# Patient Record
Sex: Female | Born: 1945 | Race: White | Hispanic: No | Smoking: Never smoker
Health system: Southern US, Community
[De-identification: ages and names within clinical notes are randomized; demographics above are authoritative.]

## PROBLEM LIST (undated history)

## (undated) DIAGNOSIS — M25561 Pain in right knee: Secondary | ICD-10-CM

## (undated) DIAGNOSIS — M1990 Unspecified osteoarthritis, unspecified site: Secondary | ICD-10-CM

## (undated) DIAGNOSIS — F329 Major depressive disorder, single episode, unspecified: Secondary | ICD-10-CM

## (undated) DIAGNOSIS — T7431XA Adult psychological abuse, confirmed, initial encounter: Secondary | ICD-10-CM

## (undated) DIAGNOSIS — M5136 Other intervertebral disc degeneration, lumbar region: Secondary | ICD-10-CM

## (undated) DIAGNOSIS — L309 Dermatitis, unspecified: Secondary | ICD-10-CM

## (undated) DIAGNOSIS — M5126 Other intervertebral disc displacement, lumbar region: Secondary | ICD-10-CM

## (undated) DIAGNOSIS — K635 Polyp of colon: Secondary | ICD-10-CM

## (undated) DIAGNOSIS — K219 Gastro-esophageal reflux disease without esophagitis: Secondary | ICD-10-CM

## (undated) DIAGNOSIS — S3210XA Unspecified fracture of sacrum, initial encounter for closed fracture: Secondary | ICD-10-CM

## (undated) DIAGNOSIS — E785 Hyperlipidemia, unspecified: Secondary | ICD-10-CM

## (undated) DIAGNOSIS — F419 Anxiety disorder, unspecified: Secondary | ICD-10-CM

## (undated) DIAGNOSIS — I872 Venous insufficiency (chronic) (peripheral): Secondary | ICD-10-CM

## (undated) DIAGNOSIS — M8580 Other specified disorders of bone density and structure, unspecified site: Secondary | ICD-10-CM

## (undated) DIAGNOSIS — N6019 Diffuse cystic mastopathy of unspecified breast: Secondary | ICD-10-CM

## (undated) DIAGNOSIS — F32A Depression, unspecified: Secondary | ICD-10-CM

## (undated) DIAGNOSIS — S322XXA Fracture of coccyx, initial encounter for closed fracture: Secondary | ICD-10-CM

## (undated) DIAGNOSIS — E079 Disorder of thyroid, unspecified: Secondary | ICD-10-CM

## (undated) HISTORY — DX: Unspecified osteoarthritis, unspecified site: M19.90

## (undated) HISTORY — DX: Other intervertebral disc degeneration, lumbar region: M51.36

## (undated) HISTORY — DX: Dermatitis, unspecified: L30.9

## (undated) HISTORY — PX: DILATION AND CURETTAGE OF UTERUS: SHX78

## (undated) HISTORY — DX: Gastro-esophageal reflux disease without esophagitis: K21.9

## (undated) HISTORY — DX: Anxiety disorder, unspecified: F41.9

## (undated) HISTORY — DX: Fracture of coccyx, initial encounter for closed fracture: S32.2XXA

## (undated) HISTORY — DX: Polyp of colon: K63.5

## (undated) HISTORY — PX: CATARACT EXTRACTION: SUR2

## (undated) HISTORY — PX: BREAST LUMPECTOMY: SHX2

## (undated) HISTORY — PX: LASIK: SHX215

## (undated) HISTORY — DX: Venous insufficiency (chronic) (peripheral): I87.2

## (undated) HISTORY — DX: Disorder of thyroid, unspecified: E07.9

## (undated) HISTORY — DX: Hyperlipidemia, unspecified: E78.5

## (undated) HISTORY — DX: Other intervertebral disc displacement, lumbar region: M51.26

## (undated) HISTORY — DX: Diffuse cystic mastopathy of unspecified breast: N60.19

## (undated) HISTORY — DX: Other specified disorders of bone density and structure, unspecified site: M85.80

## (undated) HISTORY — DX: Adult psychological abuse, confirmed, initial encounter: T74.31XA

## (undated) HISTORY — DX: Pain in right knee: M25.561

## (undated) HISTORY — DX: Unspecified fracture of sacrum, initial encounter for closed fracture: S32.10XA

---

## 1998-06-05 HISTORY — PX: ROTATOR CUFF REPAIR: SHX139

## 2002-06-05 DIAGNOSIS — IMO0002 Reserved for concepts with insufficient information to code with codable children: Secondary | ICD-10-CM

## 2002-06-05 HISTORY — DX: Reserved for concepts with insufficient information to code with codable children: IMO0002

## 2006-09-20 ENCOUNTER — Ambulatory Visit: Payer: Self-pay | Admitting: Internal Medicine

## 2007-05-10 ENCOUNTER — Ambulatory Visit: Payer: Self-pay | Admitting: Internal Medicine

## 2007-05-24 ENCOUNTER — Ambulatory Visit: Payer: Self-pay | Admitting: Internal Medicine

## 2007-05-24 ENCOUNTER — Encounter: Payer: Self-pay | Admitting: Internal Medicine

## 2009-07-08 DIAGNOSIS — F419 Anxiety disorder, unspecified: Secondary | ICD-10-CM | POA: Insufficient documentation

## 2009-07-08 DIAGNOSIS — Z87898 Personal history of other specified conditions: Secondary | ICD-10-CM | POA: Insufficient documentation

## 2009-07-08 DIAGNOSIS — F329 Major depressive disorder, single episode, unspecified: Secondary | ICD-10-CM | POA: Insufficient documentation

## 2011-02-08 DIAGNOSIS — M546 Pain in thoracic spine: Secondary | ICD-10-CM | POA: Insufficient documentation

## 2011-03-28 DIAGNOSIS — Z9889 Other specified postprocedural states: Secondary | ICD-10-CM | POA: Insufficient documentation

## 2011-05-29 ENCOUNTER — Encounter: Payer: Self-pay | Admitting: *Deleted

## 2011-05-29 ENCOUNTER — Emergency Department (HOSPITAL_BASED_OUTPATIENT_CLINIC_OR_DEPARTMENT_OTHER)
Admission: EM | Admit: 2011-05-29 | Discharge: 2011-05-29 | Disposition: A | Discharge status: Home / Self Care | Payer: Medicare Other | Attending: Emergency Medicine | Admitting: Emergency Medicine

## 2011-05-29 DIAGNOSIS — H5710 Ocular pain, unspecified eye: Secondary | ICD-10-CM | POA: Insufficient documentation

## 2011-05-29 DIAGNOSIS — F329 Major depressive disorder, single episode, unspecified: Secondary | ICD-10-CM | POA: Insufficient documentation

## 2011-05-29 DIAGNOSIS — F3289 Other specified depressive episodes: Secondary | ICD-10-CM | POA: Insufficient documentation

## 2011-05-29 HISTORY — DX: Major depressive disorder, single episode, unspecified: F32.9

## 2011-05-29 HISTORY — DX: Depression, unspecified: F32.A

## 2011-05-29 MED ORDER — TETRACAINE HCL 0.5 % OP SOLN
OPHTHALMIC | Status: AC
  Administered 2011-05-29: 13:00:00
  Filled 2011-05-29: qty 2

## 2011-05-29 MED ORDER — TOBRAMYCIN 0.3 % OP SOLN: 2.0000 [drp] | Freq: Four times a day (QID) | OPHTHALMIC | Status: AC

## 2011-05-29 MED ORDER — TOBRAMYCIN-DEXAMETHASONE 0.3-0.1 % OP SUSP
OPHTHALMIC | Status: AC
  Filled 2011-05-29: qty 2.5

## 2011-05-29 MED ORDER — ACYCLOVIR 800 MG PO TABS: 800.0000 mg | ORAL_TABLET | Freq: Every day | ORAL | Status: AC

## 2011-05-29 MED ORDER — TOBRAMYCIN 0.3 % OP SOLN
2.0000 [drp] | Freq: Four times a day (QID) | OPHTHALMIC | Status: DC
  Filled 2011-05-29: qty 5

## 2011-05-29 MED ORDER — FLUORESCEIN SODIUM 1 MG OP STRP
ORAL_STRIP | OPHTHALMIC | Status: AC
  Administered 2011-05-29: 13:00:00
  Filled 2011-05-29: qty 1

## 2011-05-29 NOTE — ED Notes (Signed)
Visual acuity with glasses right eye 20/50 left eye 20/50 both eyes 20/50

## 2011-05-29 NOTE — ED Provider Notes (Signed)
History/physical exam/procedure(s) were performed by non-physician practitioner and as supervising physician I was immediately available for consultation/collaboration. I have reviewed all notes and am in agreement with care and plan.    Hilario Quarry, MD 05/29/11 [phone]

## 2011-05-29 NOTE — ED Notes (Signed)
Right eye itching, red and swollen x 5 days.
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
## 2011-06-08 DIAGNOSIS — H00029 Hordeolum internum unspecified eye, unspecified eyelid: Secondary | ICD-10-CM | POA: Diagnosis not present

## 2011-07-12 DIAGNOSIS — L570 Actinic keratosis: Secondary | ICD-10-CM | POA: Diagnosis not present

## 2011-07-12 DIAGNOSIS — L821 Other seborrheic keratosis: Secondary | ICD-10-CM | POA: Diagnosis not present

## 2011-07-18 DIAGNOSIS — F333 Major depressive disorder, recurrent, severe with psychotic symptoms: Secondary | ICD-10-CM | POA: Diagnosis not present

## 2011-07-25 DIAGNOSIS — L570 Actinic keratosis: Secondary | ICD-10-CM | POA: Diagnosis not present

## 2011-08-03 DIAGNOSIS — F331 Major depressive disorder, recurrent, moderate: Secondary | ICD-10-CM | POA: Diagnosis not present

## 2011-08-04 DIAGNOSIS — M8580 Other specified disorders of bone density and structure, unspecified site: Secondary | ICD-10-CM

## 2011-08-04 HISTORY — DX: Other specified disorders of bone density and structure, unspecified site: M85.80

## 2011-08-14 DIAGNOSIS — M899 Disorder of bone, unspecified: Secondary | ICD-10-CM | POA: Diagnosis not present

## 2011-08-14 DIAGNOSIS — E785 Hyperlipidemia, unspecified: Secondary | ICD-10-CM | POA: Diagnosis not present

## 2011-08-14 DIAGNOSIS — M949 Disorder of cartilage, unspecified: Secondary | ICD-10-CM | POA: Diagnosis not present

## 2011-08-16 DIAGNOSIS — M949 Disorder of cartilage, unspecified: Secondary | ICD-10-CM | POA: Diagnosis not present

## 2011-08-16 DIAGNOSIS — Z1231 Encounter for screening mammogram for malignant neoplasm of breast: Secondary | ICD-10-CM | POA: Diagnosis not present

## 2011-08-18 DIAGNOSIS — F331 Major depressive disorder, recurrent, moderate: Secondary | ICD-10-CM | POA: Diagnosis not present

## 2011-08-21 DIAGNOSIS — E785 Hyperlipidemia, unspecified: Secondary | ICD-10-CM | POA: Diagnosis not present

## 2011-08-21 DIAGNOSIS — K219 Gastro-esophageal reflux disease without esophagitis: Secondary | ICD-10-CM | POA: Diagnosis not present

## 2011-08-21 DIAGNOSIS — M546 Pain in thoracic spine: Secondary | ICD-10-CM | POA: Diagnosis not present

## 2011-08-21 DIAGNOSIS — Z0000 Encounter for general adult medical examination without abnormal findings: Secondary | ICD-10-CM | POA: Diagnosis not present

## 2011-08-21 DIAGNOSIS — R928 Other abnormal and inconclusive findings on diagnostic imaging of breast: Secondary | ICD-10-CM | POA: Diagnosis not present

## 2011-08-21 DIAGNOSIS — Z803 Family history of malignant neoplasm of breast: Secondary | ICD-10-CM | POA: Diagnosis not present

## 2011-08-23 DIAGNOSIS — F331 Major depressive disorder, recurrent, moderate: Secondary | ICD-10-CM | POA: Diagnosis not present

## 2011-08-29 DIAGNOSIS — F331 Major depressive disorder, recurrent, moderate: Secondary | ICD-10-CM | POA: Diagnosis not present

## 2011-09-20 DIAGNOSIS — F330 Major depressive disorder, recurrent, mild: Secondary | ICD-10-CM | POA: Diagnosis not present

## 2011-09-25 DIAGNOSIS — F330 Major depressive disorder, recurrent, mild: Secondary | ICD-10-CM | POA: Diagnosis not present

## 2011-10-12 DIAGNOSIS — F330 Major depressive disorder, recurrent, mild: Secondary | ICD-10-CM | POA: Diagnosis not present

## 2011-11-02 DIAGNOSIS — F330 Major depressive disorder, recurrent, mild: Secondary | ICD-10-CM | POA: Diagnosis not present

## 2011-11-13 ENCOUNTER — Inpatient Hospital Stay: Admission: RE | Admit: 2011-11-13 | Payer: Medicare Other | Source: Ambulatory Visit

## 2011-11-13 ENCOUNTER — Other Ambulatory Visit: Payer: Medicare Other

## 2011-11-13 ENCOUNTER — Other Ambulatory Visit: Payer: Self-pay | Admitting: Internal Medicine

## 2011-11-13 DIAGNOSIS — R198 Other specified symptoms and signs involving the digestive system and abdomen: Secondary | ICD-10-CM | POA: Diagnosis not present

## 2011-11-13 DIAGNOSIS — K30 Functional dyspepsia: Secondary | ICD-10-CM | POA: Insufficient documentation

## 2011-11-13 DIAGNOSIS — R1013 Epigastric pain: Secondary | ICD-10-CM

## 2011-11-13 DIAGNOSIS — K3189 Other diseases of stomach and duodenum: Secondary | ICD-10-CM | POA: Diagnosis not present

## 2011-11-13 DIAGNOSIS — R109 Unspecified abdominal pain: Secondary | ICD-10-CM | POA: Diagnosis not present

## 2011-11-13 DIAGNOSIS — R143 Flatulence: Secondary | ICD-10-CM | POA: Diagnosis not present

## 2011-11-13 DIAGNOSIS — R141 Gas pain: Secondary | ICD-10-CM | POA: Diagnosis not present

## 2011-11-14 ENCOUNTER — Other Ambulatory Visit: Payer: Medicare Other

## 2011-11-15 ENCOUNTER — Ambulatory Visit
Admission: RE | Admit: 2011-11-15 | Discharge: 2011-11-15 | Disposition: A | Discharge status: Home / Self Care | Payer: Medicare Other | Source: Ambulatory Visit | Attending: Internal Medicine | Admitting: Internal Medicine

## 2011-11-15 DIAGNOSIS — R1013 Epigastric pain: Secondary | ICD-10-CM

## 2011-11-15 DIAGNOSIS — R141 Gas pain: Secondary | ICD-10-CM | POA: Diagnosis not present

## 2011-11-15 DIAGNOSIS — R142 Eructation: Secondary | ICD-10-CM | POA: Diagnosis not present

## 2011-11-15 DIAGNOSIS — K5900 Constipation, unspecified: Secondary | ICD-10-CM | POA: Diagnosis not present

## 2011-11-15 DIAGNOSIS — K3189 Other diseases of stomach and duodenum: Secondary | ICD-10-CM | POA: Diagnosis not present

## 2011-11-16 DIAGNOSIS — F330 Major depressive disorder, recurrent, mild: Secondary | ICD-10-CM | POA: Diagnosis not present

## 2011-11-21 DIAGNOSIS — IMO0002 Reserved for concepts with insufficient information to code with codable children: Secondary | ICD-10-CM | POA: Diagnosis not present

## 2011-12-12 DIAGNOSIS — F330 Major depressive disorder, recurrent, mild: Secondary | ICD-10-CM | POA: Diagnosis not present

## 2012-01-08 DIAGNOSIS — F330 Major depressive disorder, recurrent, mild: Secondary | ICD-10-CM | POA: Diagnosis not present

## 2012-01-30 DIAGNOSIS — F330 Major depressive disorder, recurrent, mild: Secondary | ICD-10-CM | POA: Diagnosis not present

## 2012-02-14 DIAGNOSIS — H524 Presbyopia: Secondary | ICD-10-CM | POA: Diagnosis not present

## 2012-02-14 DIAGNOSIS — H353190 Nonexudative age-related macular degeneration, unspecified eye, stage unspecified: Secondary | ICD-10-CM | POA: Diagnosis not present

## 2012-02-14 DIAGNOSIS — H2510 Age-related nuclear cataract, unspecified eye: Secondary | ICD-10-CM | POA: Diagnosis not present

## 2012-02-22 DIAGNOSIS — F331 Major depressive disorder, recurrent, moderate: Secondary | ICD-10-CM | POA: Diagnosis not present

## 2012-03-15 DIAGNOSIS — IMO0002 Reserved for concepts with insufficient information to code with codable children: Secondary | ICD-10-CM | POA: Diagnosis not present

## 2012-03-21 DIAGNOSIS — IMO0002 Reserved for concepts with insufficient information to code with codable children: Secondary | ICD-10-CM | POA: Diagnosis not present

## 2012-04-11 DIAGNOSIS — F330 Major depressive disorder, recurrent, mild: Secondary | ICD-10-CM | POA: Diagnosis not present

## 2012-06-11 DIAGNOSIS — F330 Major depressive disorder, recurrent, mild: Secondary | ICD-10-CM | POA: Diagnosis not present

## 2012-07-10 DIAGNOSIS — H9319 Tinnitus, unspecified ear: Secondary | ICD-10-CM | POA: Diagnosis not present

## 2012-07-10 DIAGNOSIS — J387 Other diseases of larynx: Secondary | ICD-10-CM | POA: Diagnosis not present

## 2012-07-10 DIAGNOSIS — J310 Chronic rhinitis: Secondary | ICD-10-CM | POA: Diagnosis not present

## 2012-07-10 DIAGNOSIS — H905 Unspecified sensorineural hearing loss: Secondary | ICD-10-CM | POA: Diagnosis not present

## 2012-07-10 DIAGNOSIS — K219 Gastro-esophageal reflux disease without esophagitis: Secondary | ICD-10-CM | POA: Diagnosis not present

## 2012-07-29 DIAGNOSIS — F330 Major depressive disorder, recurrent, mild: Secondary | ICD-10-CM | POA: Diagnosis not present

## 2012-08-05 DIAGNOSIS — IMO0002 Reserved for concepts with insufficient information to code with codable children: Secondary | ICD-10-CM | POA: Diagnosis not present

## 2012-08-19 DIAGNOSIS — R82998 Other abnormal findings in urine: Secondary | ICD-10-CM | POA: Diagnosis not present

## 2012-08-19 DIAGNOSIS — E785 Hyperlipidemia, unspecified: Secondary | ICD-10-CM | POA: Diagnosis not present

## 2012-08-19 DIAGNOSIS — M810 Age-related osteoporosis without current pathological fracture: Secondary | ICD-10-CM | POA: Diagnosis not present

## 2012-08-26 DIAGNOSIS — K3189 Other diseases of stomach and duodenum: Secondary | ICD-10-CM | POA: Diagnosis not present

## 2012-08-26 DIAGNOSIS — M1990 Unspecified osteoarthritis, unspecified site: Secondary | ICD-10-CM | POA: Insufficient documentation

## 2012-08-26 DIAGNOSIS — N6019 Diffuse cystic mastopathy of unspecified breast: Secondary | ICD-10-CM | POA: Diagnosis not present

## 2012-08-26 DIAGNOSIS — L259 Unspecified contact dermatitis, unspecified cause: Secondary | ICD-10-CM | POA: Diagnosis not present

## 2012-08-26 DIAGNOSIS — R141 Gas pain: Secondary | ICD-10-CM | POA: Diagnosis not present

## 2012-08-26 DIAGNOSIS — I872 Venous insufficiency (chronic) (peripheral): Secondary | ICD-10-CM | POA: Diagnosis not present

## 2012-08-26 DIAGNOSIS — Z0000 Encounter for general adult medical examination without abnormal findings: Secondary | ICD-10-CM | POA: Diagnosis not present

## 2012-08-26 DIAGNOSIS — R142 Eructation: Secondary | ICD-10-CM | POA: Diagnosis not present

## 2012-08-26 DIAGNOSIS — F341 Dysthymic disorder: Secondary | ICD-10-CM | POA: Diagnosis not present

## 2012-08-26 DIAGNOSIS — R1013 Epigastric pain: Secondary | ICD-10-CM | POA: Diagnosis not present

## 2012-08-26 DIAGNOSIS — S335XXA Sprain of ligaments of lumbar spine, initial encounter: Secondary | ICD-10-CM | POA: Diagnosis not present

## 2012-09-03 DIAGNOSIS — Z1212 Encounter for screening for malignant neoplasm of rectum: Secondary | ICD-10-CM | POA: Diagnosis not present

## 2012-09-10 DIAGNOSIS — F330 Major depressive disorder, recurrent, mild: Secondary | ICD-10-CM | POA: Diagnosis not present

## 2012-09-24 DIAGNOSIS — F330 Major depressive disorder, recurrent, mild: Secondary | ICD-10-CM | POA: Diagnosis not present

## 2012-10-22 DIAGNOSIS — F330 Major depressive disorder, recurrent, mild: Secondary | ICD-10-CM | POA: Diagnosis not present

## 2012-11-11 ENCOUNTER — Ambulatory Visit (INDEPENDENT_AMBULATORY_CARE_PROVIDER_SITE_OTHER): Payer: Medicare Other | Admitting: Nurse Practitioner

## 2012-11-11 ENCOUNTER — Encounter: Payer: Self-pay | Admitting: Nurse Practitioner

## 2012-11-11 VITALS — BP 132/78 | HR 68 | Resp 14 | Ht 67.0 in | Wt 175.8 lb

## 2012-11-11 DIAGNOSIS — M8580 Other specified disorders of bone density and structure, unspecified site: Secondary | ICD-10-CM

## 2012-11-11 DIAGNOSIS — Z124 Encounter for screening for malignant neoplasm of cervix: Secondary | ICD-10-CM | POA: Diagnosis not present

## 2012-11-11 DIAGNOSIS — M899 Disorder of bone, unspecified: Secondary | ICD-10-CM

## 2012-11-11 DIAGNOSIS — Z01419 Encounter for gynecological examination (general) (routine) without abnormal findings: Secondary | ICD-10-CM

## 2012-11-11 DIAGNOSIS — M949 Disorder of cartilage, unspecified: Secondary | ICD-10-CM | POA: Diagnosis not present

## 2012-11-11 NOTE — Patient Instructions (Addendum)
EXERCISE AND DIET:  We recommended that you start or continue a regular exercise program for good health. Regular exercise means any activity that makes your heart beat faster and makes you sweat.  We recommend exercising at least 30 minutes per day at least 3 days a week, preferably 4 or 5.  We also recommend a diet low in fat and sugar.  Inactivity, poor dietary choices and obesity can cause diabetes, heart attack, stroke, and kidney damage, among others.    ALCOHOL AND SMOKING:  Women should limit their alcohol intake to no more than 7 drinks/beers/glasses of wine (combined, not each!) per week. Moderation of alcohol intake to this level decreases your risk of breast cancer and liver damage. And of course, no recreational drugs are part of a healthy lifestyle.  And absolutely no smoking or even second hand smoke. Most people know smoking can cause heart and lung diseases, but did you know it also contributes to weakening of your bones? Aging of your skin?  Yellowing of your teeth and nails?  CALCIUM AND VITAMIN D:  Adequate intake of calcium and Vitamin D are recommended.  The recommendations for exact amounts of these supplements seem to change often, but generally speaking 600 mg of calcium (either carbonate or citrate) and 800 units of Vitamin D per day seems prudent. Certain women may benefit from higher intake of Vitamin D.  If you are among these women, your doctor will have told you during your visit.    PAP SMEARS:  Pap smears, to check for cervical cancer or precancers,  have traditionally been done yearly, although recent scientific advances have shown that most women can have pap smears less often.  However, every woman still should have a physical exam from her gynecologist every year. It will include a breast check, inspection of the vulva and vagina to check for abnormal growths or skin changes, a visual exam of the cervix, and then an exam to evaluate the size and shape of the uterus and  ovaries.  And after 68 years of age, a rectal exam is indicated to check for rectal cancers. We will also provide age appropriate advice regarding health maintenance, like when you should have certain vaccines, screening for sexually transmitted diseases, bone density testing, colonoscopy, mammograms, etc.   MAMMOGRAMS:  All women over 40 years old should have a yearly mammogram. Many facilities now offer a "3D" mammogram, which may cost around $50 extra out of pocket. If possible,  we recommend you accept the option to have the 3D mammogram performed.  It both reduces the number of women who will be called back for extra views which then turn out to be normal, and it is better than the routine mammogram at detecting truly abnormal areas.    COLONOSCOPY:  Colonoscopy to screen for colon cancer is recommended for all women at age 50.  We know, you hate the idea of the prep.  We agree, BUT, having colon cancer and not knowing it is worse!!  Colon cancer so often starts as a polyp that can be seen and removed at colonscopy, which can quite literally save your life!  And if your first colonoscopy is normal and you have no family history of colon cancer, most women don't have to have it again for 10 years.  Once every ten years, you can do something that may end up saving your life, right?  We will be happy to help you get it scheduled when you are ready.    Be sure to check your insurance coverage so you understand how much it will cost.  It may be covered as a preventative service at no cost, but you should check your particular policy.      Colonoscopy done 05/24/07     Atrophic Vaginitis Atrophic vaginitis is a problem of low levels of estrogen in women. This problem can happen at any age. It is most common in women who have gone through menopause ("the change").  HOW WILL I KNOW IF I HAVE THIS PROBLEM? You may have:  Trouble with peeing (urinating), such as:  Going to the bathroom often.  A hard  time holding your pee until you reach a bathroom.  Leaking pee.  Having pain when you pee.  Itching or a burning feeling.  Vaginal bleeding and spotting.  Pain during sex.  Dryness of the vagina.  A yellow, bad-smelling fluid (discharge) coming from the vagina. HOW WILL MY DOCTOR CHECK FOR THIS PROBLEM?  During your exam, your doctor will likely find the problem.  If there is a vaginal fluid, it may be checked for infection. HOW WILL THIS PROBLEM BE TREATED? Keep the vulvar skin as clean as possible. Moisturizers and lubricants can help with some of the symptoms. Estrogen replacement can help. There are 2 ways to take estrogen:  Systemic estrogen gets estrogen to your whole body. It takes many weeks or months before the symptoms get better.  You take an estrogen pill.  You use a skin patch. This is a patch that you put on your skin.  If you still have your uterus, your doctor may ask you to take a hormone. Talk to your doctor about the right medicine for you.  Estrogen cream.  This puts estrogen only at the part of your body where you apply it. The cream is put into the vagina or put on the vulvar skin. For some women, estrogen cream works faster than pills or the patch. CAN ALL WOMEN WITH THIS PROBLEM USE ESTROGEN? No. Women with certain types of cancer, liver problems, or problems with blood clots should not take estrogen. Your doctor can help you decide the best treatment for your symptoms. Document Released: 11/08/2007 Document Revised: 08/14/2011 Document Reviewed: 11/08/2007 [facility] Patient Information 2014 [place], [place].
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
## 2012-11-12 ENCOUNTER — Encounter: Payer: Self-pay | Admitting: Nurse Practitioner

## 2012-11-12 LAB — IPS PAP SMEAR ONLY

## 2012-11-14 NOTE — Progress Notes (Signed)
Reviewed personally.  M. Suzanne Bindi Klomp, MD.  

## 2012-11-26 DIAGNOSIS — F330 Major depressive disorder, recurrent, mild: Secondary | ICD-10-CM | POA: Diagnosis not present

## 2012-11-27 DIAGNOSIS — M899 Disorder of bone, unspecified: Secondary | ICD-10-CM | POA: Diagnosis not present

## 2012-11-27 DIAGNOSIS — Z1231 Encounter for screening mammogram for malignant neoplasm of breast: Secondary | ICD-10-CM | POA: Diagnosis not present

## 2012-11-27 DIAGNOSIS — Z8262 Family history of osteoporosis: Secondary | ICD-10-CM | POA: Diagnosis not present

## 2012-11-27 DIAGNOSIS — M949 Disorder of cartilage, unspecified: Secondary | ICD-10-CM | POA: Diagnosis not present

## 2012-12-03 DIAGNOSIS — Z111 Encounter for screening for respiratory tuberculosis: Secondary | ICD-10-CM | POA: Diagnosis not present

## 2012-12-04 DIAGNOSIS — M810 Age-related osteoporosis without current pathological fracture: Secondary | ICD-10-CM | POA: Insufficient documentation

## 2012-12-24 DIAGNOSIS — IMO0002 Reserved for concepts with insufficient information to code with codable children: Secondary | ICD-10-CM | POA: Diagnosis not present

## 2013-01-07 DIAGNOSIS — M25519 Pain in unspecified shoulder: Secondary | ICD-10-CM | POA: Diagnosis not present

## 2013-01-08 ENCOUNTER — Other Ambulatory Visit: Payer: Self-pay

## 2013-02-06 DIAGNOSIS — M25519 Pain in unspecified shoulder: Secondary | ICD-10-CM | POA: Diagnosis not present

## 2013-02-10 DIAGNOSIS — IMO0002 Reserved for concepts with insufficient information to code with codable children: Secondary | ICD-10-CM | POA: Diagnosis not present

## 2013-02-18 DIAGNOSIS — F330 Major depressive disorder, recurrent, mild: Secondary | ICD-10-CM | POA: Diagnosis not present

## 2013-03-06 DIAGNOSIS — Z23 Encounter for immunization: Secondary | ICD-10-CM | POA: Diagnosis not present

## 2013-03-18 DIAGNOSIS — F331 Major depressive disorder, recurrent, moderate: Secondary | ICD-10-CM | POA: Diagnosis not present

## 2013-04-10 ENCOUNTER — Other Ambulatory Visit: Payer: Self-pay

## 2013-06-17 DIAGNOSIS — F333 Major depressive disorder, recurrent, severe with psychotic symptoms: Secondary | ICD-10-CM | POA: Diagnosis not present

## 2013-08-04 DIAGNOSIS — F331 Major depressive disorder, recurrent, moderate: Secondary | ICD-10-CM | POA: Diagnosis not present

## 2013-08-05 DIAGNOSIS — F330 Major depressive disorder, recurrent, mild: Secondary | ICD-10-CM | POA: Diagnosis not present

## 2013-08-25 DIAGNOSIS — Z79899 Other long term (current) drug therapy: Secondary | ICD-10-CM | POA: Diagnosis not present

## 2013-08-25 DIAGNOSIS — M810 Age-related osteoporosis without current pathological fracture: Secondary | ICD-10-CM | POA: Diagnosis not present

## 2013-08-25 DIAGNOSIS — E785 Hyperlipidemia, unspecified: Secondary | ICD-10-CM | POA: Diagnosis not present

## 2013-08-26 DIAGNOSIS — F330 Major depressive disorder, recurrent, mild: Secondary | ICD-10-CM | POA: Diagnosis not present

## 2013-09-02 DIAGNOSIS — K5900 Constipation, unspecified: Secondary | ICD-10-CM | POA: Insufficient documentation

## 2013-09-02 DIAGNOSIS — R059 Cough, unspecified: Secondary | ICD-10-CM | POA: Diagnosis not present

## 2013-09-02 DIAGNOSIS — Z0000 Encounter for general adult medical examination without abnormal findings: Secondary | ICD-10-CM | POA: Diagnosis not present

## 2013-09-02 DIAGNOSIS — M810 Age-related osteoporosis without current pathological fracture: Secondary | ICD-10-CM | POA: Diagnosis not present

## 2013-09-02 DIAGNOSIS — R05 Cough: Secondary | ICD-10-CM | POA: Diagnosis not present

## 2013-09-02 DIAGNOSIS — E785 Hyperlipidemia, unspecified: Secondary | ICD-10-CM | POA: Diagnosis not present

## 2013-09-02 DIAGNOSIS — Z1331 Encounter for screening for depression: Secondary | ICD-10-CM | POA: Diagnosis not present

## 2013-09-02 DIAGNOSIS — F341 Dysthymic disorder: Secondary | ICD-10-CM | POA: Diagnosis not present

## 2013-09-02 DIAGNOSIS — M1990 Unspecified osteoarthritis, unspecified site: Secondary | ICD-10-CM | POA: Diagnosis not present

## 2013-09-02 DIAGNOSIS — K3189 Other diseases of stomach and duodenum: Secondary | ICD-10-CM | POA: Diagnosis not present

## 2013-09-11 ENCOUNTER — Encounter: Payer: Self-pay | Admitting: Internal Medicine

## 2013-09-11 ENCOUNTER — Telehealth: Payer: Self-pay | Admitting: Internal Medicine

## 2013-09-11 NOTE — Telephone Encounter (Signed)
Error

## 2013-09-11 NOTE — Telephone Encounter (Signed)
Tracy Rhodes is not available.  I will call back later

## 2013-09-11 NOTE — Telephone Encounter (Signed)
I spoke with Tracy Rhodes, they will keep the appt for 11/03/13.  They will call back if they need her seen earlier

## 2013-09-23 DIAGNOSIS — F330 Major depressive disorder, recurrent, mild: Secondary | ICD-10-CM | POA: Diagnosis not present

## 2013-09-25 DIAGNOSIS — H5200 Hypermetropia, unspecified eye: Secondary | ICD-10-CM | POA: Diagnosis not present

## 2013-09-25 DIAGNOSIS — H353190 Nonexudative age-related macular degeneration, unspecified eye, stage unspecified: Secondary | ICD-10-CM | POA: Diagnosis not present

## 2013-11-03 ENCOUNTER — Ambulatory Visit (INDEPENDENT_AMBULATORY_CARE_PROVIDER_SITE_OTHER): Payer: Medicare Other | Admitting: Internal Medicine

## 2013-11-03 ENCOUNTER — Encounter: Payer: Self-pay | Admitting: Internal Medicine

## 2013-11-03 VITALS — BP 104/60 | HR 80 | Ht 66.75 in | Wt 175.1 lb

## 2013-11-03 DIAGNOSIS — K648 Other hemorrhoids: Secondary | ICD-10-CM

## 2013-11-03 DIAGNOSIS — K5900 Constipation, unspecified: Secondary | ICD-10-CM

## 2013-11-03 DIAGNOSIS — K219 Gastro-esophageal reflux disease without esophagitis: Secondary | ICD-10-CM

## 2013-11-03 DIAGNOSIS — K649 Unspecified hemorrhoids: Secondary | ICD-10-CM | POA: Diagnosis not present

## 2013-11-03 DIAGNOSIS — R195 Other fecal abnormalities: Secondary | ICD-10-CM | POA: Diagnosis not present

## 2013-11-03 DIAGNOSIS — R109 Unspecified abdominal pain: Secondary | ICD-10-CM

## 2013-11-03 MED ORDER — LINACLOTIDE 290 MCG PO CAPS: 290.0000 ug | ORAL_CAPSULE | Freq: Every day | ORAL | Status: AC

## 2013-11-03 NOTE — Patient Instructions (Addendum)
You have been scheduled for a colonoscopy with propofol. Please follow written instructions given to you at your visit today.   If you use inhalers (even only as needed), please bring them with you on the day of your procedure. Your physician has requested that you go to www.startemmi.com and enter the access code given to you at your visit today. This web site gives a general overview about your procedure. However, you should still follow specific instructions given to you by our office regarding your preparation for the procedure.  You have been given some samples of Linzess.  Take one capsule 30 minutes before breakfast.  If this works well for you we can send in a prescription.
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
## 2013-11-04 DIAGNOSIS — F330 Major depressive disorder, recurrent, mild: Secondary | ICD-10-CM | POA: Diagnosis not present

## 2013-11-06 ENCOUNTER — Telehealth: Payer: Self-pay

## 2013-11-06 NOTE — Telephone Encounter (Signed)
Spoke with patient to let her know I needed to reschedule her colonoscopy.  Scheduled her for 12/15/2013 at 1:30pm.  Told her Denyse Amass would watch for cancellations for her.  I told her to let me know if that date was ok and I would mail her new instructions.  Patient agreed

## 2013-11-06 NOTE — Telephone Encounter (Signed)
I appreciate that update :)

## 2013-11-06 NOTE — Telephone Encounter (Signed)
Pt sent the following in the patient questionnaire. Wanted to make sure Dr. Henrene Pastor was aware, see below.  My intestinal area is still sensitive to touch. I used the Linzess just one time and it really did very well. It was gentle and effective; however, the stomach/intestinal area is still somewhat sensitive. I forgot to tell to Dr. Henrene Pastor that about 12 years ago I fell down the stairs and injured my tailbone. I am noticing some real discomfort in that area again. He may need to know that.

## 2013-11-12 ENCOUNTER — Encounter: Payer: Self-pay | Admitting: Nurse Practitioner

## 2013-11-12 ENCOUNTER — Ambulatory Visit (INDEPENDENT_AMBULATORY_CARE_PROVIDER_SITE_OTHER): Payer: Medicare Other | Admitting: Nurse Practitioner

## 2013-11-12 VITALS — BP 112/60 | HR 72 | Resp 18 | Ht 67.0 in | Wt 175.0 lb

## 2013-11-12 DIAGNOSIS — N952 Postmenopausal atrophic vaginitis: Secondary | ICD-10-CM

## 2013-11-12 DIAGNOSIS — M810 Age-related osteoporosis without current pathological fracture: Secondary | ICD-10-CM | POA: Diagnosis not present

## 2013-11-12 DIAGNOSIS — Z124 Encounter for screening for malignant neoplasm of cervix: Secondary | ICD-10-CM

## 2013-11-12 DIAGNOSIS — Z0000 Encounter for general adult medical examination without abnormal findings: Secondary | ICD-10-CM

## 2013-11-12 DIAGNOSIS — Z01419 Encounter for gynecological examination (general) (routine) without abnormal findings: Secondary | ICD-10-CM

## 2013-11-12 LAB — POCT URINALYSIS DIPSTICK
BILIRUBIN UA: NEGATIVE
GLUCOSE UA: NEGATIVE
KETONES UA: NEGATIVE
LEUKOCYTES UA: NEGATIVE
NITRITE UA: NEGATIVE
PH UA: 7
Protein, UA: NEGATIVE
RBC UA: NEGATIVE
Urobilinogen, UA: NEGATIVE

## 2013-11-12 MED ORDER — ESTROGENS, CONJUGATED 0.625 MG/GM VA CREA: TOPICAL_CREAM | VAGINAL | Status: DC

## 2013-11-12 NOTE — Patient Instructions (Signed)
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
## 2013-11-17 NOTE — Progress Notes (Signed)
Encounter reviewed by Dr. Brook Silva.  

## 2013-11-19 DIAGNOSIS — F330 Major depressive disorder, recurrent, mild: Secondary | ICD-10-CM | POA: Diagnosis not present

## 2013-11-25 DIAGNOSIS — IMO0002 Reserved for concepts with insufficient information to code with codable children: Secondary | ICD-10-CM | POA: Diagnosis not present

## 2013-11-28 DIAGNOSIS — Z1231 Encounter for screening mammogram for malignant neoplasm of breast: Secondary | ICD-10-CM | POA: Diagnosis not present

## 2013-11-28 DIAGNOSIS — Z803 Family history of malignant neoplasm of breast: Secondary | ICD-10-CM | POA: Diagnosis not present

## 2013-12-08 ENCOUNTER — Telehealth: Payer: Self-pay | Admitting: Internal Medicine

## 2013-12-08 ENCOUNTER — Telehealth: Payer: Self-pay | Admitting: Gynecology

## 2013-12-08 NOTE — Telephone Encounter (Signed)
Patient wants to know if there is an alternative medication for premarin it cost her $290

## 2013-12-08 NOTE — Telephone Encounter (Signed)
Tracy Cage, FNP patient is requesting to switch to another medication as premarin is $290. Savings card is unfortunately not eligible with patient's insurance. Please advise.

## 2013-12-08 NOTE — Telephone Encounter (Signed)
Other vaginal estrogen options are Vagifem and Estring - both of which are probably more expensive.  However she can check with her insurance to make sure.

## 2013-12-09 NOTE — Telephone Encounter (Signed)
Spoke with patient. Advised of message as seen below from Milford Cage, [place]. Patient is agreeable and will look up cost for Vagifem and Estring and compare prices and call back with decision on what she would like to do.

## 2013-12-09 NOTE — Telephone Encounter (Signed)
Left message to call Kaitlyn at [phone]. 

## 2013-12-10 NOTE — Telephone Encounter (Signed)
Spoke with patient and clarified that the only other med mentioned at her office visit was the Linzess samples she was given.  I told her to call me when they ran out and I would send in a prescription.  We also clarified the instructions for her colonoscopy since she had rescheduled it.  Patient agreed to call me if she had any questions about her rescheduled procedure.

## 2013-12-10 NOTE — Telephone Encounter (Signed)
LM on VM.

## 2013-12-15 ENCOUNTER — Encounter: Payer: Self-pay | Admitting: Internal Medicine

## 2013-12-15 ENCOUNTER — Ambulatory Visit (AMBULATORY_SURGERY_CENTER): Payer: Medicare Other | Admitting: Internal Medicine

## 2013-12-15 VITALS — BP 128/67 | HR 68 | Temp 96.8°F | Resp 16 | Ht 66.75 in | Wt 175.0 lb

## 2013-12-15 DIAGNOSIS — E669 Obesity, unspecified: Secondary | ICD-10-CM | POA: Diagnosis not present

## 2013-12-15 DIAGNOSIS — K5900 Constipation, unspecified: Secondary | ICD-10-CM | POA: Diagnosis not present

## 2013-12-15 DIAGNOSIS — D126 Benign neoplasm of colon, unspecified: Secondary | ICD-10-CM

## 2013-12-15 DIAGNOSIS — R195 Other fecal abnormalities: Secondary | ICD-10-CM

## 2013-12-15 DIAGNOSIS — I872 Venous insufficiency (chronic) (peripheral): Secondary | ICD-10-CM | POA: Diagnosis not present

## 2013-12-15 DIAGNOSIS — R1032 Left lower quadrant pain: Secondary | ICD-10-CM | POA: Diagnosis not present

## 2013-12-15 DIAGNOSIS — F341 Dysthymic disorder: Secondary | ICD-10-CM | POA: Diagnosis not present

## 2013-12-15 MED ORDER — SODIUM CHLORIDE 0.9 % IV SOLN: 500.0000 mL | INTRAVENOUS | Status: DC

## 2013-12-15 NOTE — Patient Instructions (Signed)
YOU HAD AN ENDOSCOPIC PROCEDURE TODAY AT THE [facility]: Refer to the procedure report that was given to you for any specific questions about what was found during the examination.  If the procedure report does not answer your questions, please call your gastroenterologist to clarify.  If you requested that your care partner not be given the details of your procedure findings, then the procedure report has been included in a sealed envelope for you to review at your convenience later.  YOU SHOULD EXPECT: Some feelings of bloating in the abdomen. Passage of more gas than usual.  Walking can help get rid of the air that was put into your GI tract during the procedure and reduce the bloating. If you had a lower endoscopy (such as a colonoscopy or flexible sigmoidoscopy) you may notice spotting of blood in your stool or on the toilet paper. If you underwent a bowel prep for your procedure, then you may not have a normal bowel movement for a few days.  DIET: Your first meal following the procedure should be a light meal and then it is ok to progress to your normal diet.  A half-sandwich or bowl of soup is an example of a good first meal.  Heavy or fried foods are harder to digest and may make you feel nauseous or bloated.  Likewise meals heavy in dairy and vegetables can cause extra gas to form and this can also increase the bloating.  Drink plenty of fluids but you should avoid alcoholic beverages for 24 hours.  ACTIVITY: Your care partner should take you home directly after the procedure.  You should plan to take it easy, moving slowly for the rest of the day.  You can resume normal activity the day after the procedure however you should NOT DRIVE or use heavy machinery for 24 hours (because of the sedation medicines used during the test).    SYMPTOMS TO REPORT IMMEDIATELY: A gastroenterologist can be reached at any hour.  During normal business hours, 8:30 AM to 5:00 PM Monday through Friday,  call [phone].  After hours and on weekends, please call the GI answering service at [phone] who will take a message and have the physician on call contact you.   Following lower endoscopy (colonoscopy or flexible sigmoidoscopy):  Excessive amounts of blood in the stool  Significant tenderness or worsening of abdominal pains  Swelling of the abdomen that is new, acute  Fever of 100F or higher  Following upper endoscopy (EGD)  Vomiting of blood or coffee ground material  New chest pain or pain under the shoulder blades  Painful or persistently difficult swallowing  New shortness of breath  Fever of 100F or higher  Black, tarry-looking stools  FOLLOW UP: If any biopsies were taken you will be contacted by phone or by letter within the next 1-3 weeks.  Call your gastroenterologist if you have not heard about the biopsies in 3 weeks.  Our staff will call the home number listed on your records the next business day following your procedure to check on you and address any questions or concerns that you may have at that time regarding the information given to you following your procedure. This is a courtesy call and so if there is no answer at the home number and we have not heard from you through the emergency physician on call, we will assume that you have returned to your regular daily activities without incident.  SIGNATURES/CONFIDENTIALITY: You and/or your care   partner have signed paperwork which will be entered into your electronic medical record.  These signatures attest to the fact that that the information above on your After Visit Summary has been reviewed and is understood.  Full responsibility of the confidentiality of this discharge information lies with you and/or your care-partner.YOU HAD AN ENDOSCOPIC PROCEDURE TODAY AT [facility]: Refer to the procedure report that was given to you for any specific questions about what was found during the examination.   If the procedure report does not answer your questions, please call your gastroenterologist to clarify.  If you requested that your care partner not be given the details of your procedure findings, then the procedure report has been included in a sealed envelope for you to review at your convenience later.  YOU SHOULD EXPECT: Some feelings of bloating in the abdomen. Passage of more gas than usual.  Walking can help get rid of the air that was put into your GI tract during the procedure and reduce the bloating. If you had a lower endoscopy (such as a colonoscopy or flexible sigmoidoscopy) you may notice spotting of blood in your stool or on the toilet paper. If you underwent a bowel prep for your procedure, then you may not have a normal bowel movement for a few days.  DIET: Your first meal following the procedure should be a light meal and then it is ok to progress to your normal diet.  A half-sandwich or bowl of soup is an example of a good first meal.  Heavy or fried foods are harder to digest and may make you feel nauseous or bloated.  Likewise meals heavy in dairy and vegetables can cause extra gas to form and this can also increase the bloating.  Drink plenty of fluids but you should avoid alcoholic beverages for 24 hours.  ACTIVITY: Your care partner should take you home directly after the procedure.  You should plan to take it easy, moving slowly for the rest of the day.  You can resume normal activity the day after the procedure however you should NOT DRIVE or use heavy machinery for 24 hours (because of the sedation medicines used during the test).    SYMPTOMS TO REPORT IMMEDIATELY: A gastroenterologist can be reached at any hour.  During normal business hours, 8:30 AM to 5:00 PM Monday through Friday, call [phone].  After hours and on weekends, please call the GI answering service at [phone] who will take a message and have the physician on call contact you.   Following lower  endoscopy (colonoscopy or flexible sigmoidoscopy):  Excessive amounts of blood in the stool  Significant tenderness or worsening of abdominal pains  Swelling of the abdomen that is new, acute  Fever of 100F or highe  FOLLOW UP: If any biopsies were taken you will be contacted by phone or by letter within the next 1-3 weeks.  Call your gastroenterologist if you have not heard about the biopsies in 3 weeks.  Our staff will call the home number listed on your records the next business day following your procedure to check on you and address any questions or concerns that you may have at that time regarding the information given to you following your procedure. This is a courtesy call and so if there is no answer at the home number and we have not heard from you through the emergency physician on call, we will assume that you have returned to your regular daily activities  without incident.  SIGNATURES/CONFIDENTIALITY: You and/or your care partner have signed paperwork which will be entered into your electronic medical record.  These signatures attest to the fact that that the information above on your After Visit Summary has been reviewed and is understood.  Full responsibility of the confidentiality of this discharge information lies with you and/or your care-partner.  Polyp information given.  Dr.Perry wlll advice about next colonoscopy after pathology reports are reviewed.

## 2013-12-15 NOTE — Progress Notes (Signed)
Report to PACU, RN, vss, BBS= Clear.  

## 2013-12-15 NOTE — Progress Notes (Signed)
Called to room to assist during endoscopic procedure.  Patient ID and intended procedure confirmed with present staff. Received instructions for my participation in the procedure from the performing physician.  

## 2013-12-15 NOTE — Op Note (Addendum)
[place]  [address] [place], [address]   COLONOSCOPY PROCEDURE REPORT  PATIENT: Tracy Rhodes, Tracy Rhodes  MR#: [id number] BIRTHDATE: 09-05-1945 , 67  yrs. old GENDER: Female ENDOSCOPIST: Eustace Quail, MD REFERRED GH:WEXH Virgina Jock, M.D. PROCEDURE DATE:  12/15/2013 PROCEDURE:   Colonoscopy with snare polypectomy x 2 First Screening Colonoscopy - Avg.  risk and is 50 yrs.  old or older - No.  Prior Negative Screening - Now for repeat screening. N/A  History of Adenoma - Now for follow-up colonoscopy & has been > or = to 3 yrs.  N/A  Polyps Removed Today? Yes. ASA CLASS:   Class II INDICATIONS:heme-positive stool. Prior exam December 2008 (hyperplastic polyp). MEDICATIONS: MAC sedation, administered by CRNA and propofol (Diprivan) 280mg  IV  DESCRIPTION OF PROCEDURE:   After the risks benefits and alternatives of the procedure were thoroughly explained, informed consent was obtained.  A digital rectal exam revealed no abnormalities of the rectum.   The LB [id number]  endoscope was introduced through the anus and advanced to the cecum, which was identified by both the appendix and ileocecal valve. No adverse events experienced.   The quality of the prep was excellent, using MoviPrep  The instrument was then slowly withdrawn as the colon was fully examined.   COLON FINDINGS: Two diminutive polyps were found in the ascending colon and transverse colon.  A polypectomy was performed with a cold snare.  The resection was complete and the polyp tissue was completely retrieved.   The colon was otherwise normal.  There was no inflammation, polyps or cancers unless previously stated. Retroflexed views revealed internal hemorrhoids. The time to cecum=2 minutes 35 seconds.  Withdrawal time=11 minutes 16 seconds. The scope was withdrawn and the procedure completed. COMPLICATIONS: There were no complications.  ENDOSCOPIC IMPRESSION: 1.   Two diminutive polyps were  found in the ascending and transverse colon; polypectomy was performed with a cold snare 2.   The colon was otherwise normal  RECOMMENDATIONS: 1. Repeat colonoscopy in 5 years if polyp adenomatous; otherwise 10 years   eSigned:  Eustace Quail, MD 12/15/2013 2:03 PM   cc: Shon Baton, MD and The Patient

## 2013-12-16 ENCOUNTER — Telehealth: Payer: Self-pay | Admitting: *Deleted

## 2013-12-16 NOTE — Telephone Encounter (Signed)
  Follow up Call-  Call back number 12/15/2013  Post procedure Call Back phone  # [phone] hm  Permission to leave phone message Yes     Patient questions:  Do you have a fever, pain , or abdominal swelling? No. Pain Score  0 *  Have you tolerated food without any problems? Yes.    Have you been able to return to your normal activities? Yes.    Do you have any questions about your discharge instructions: Diet   No. Medications  No. Follow up visit  No.  Do you have questions or concerns about your Care? No.  Actions: * If pain score is 4 or above: No action needed, pain <4.

## 2013-12-18 ENCOUNTER — Encounter: Payer: Self-pay | Admitting: Internal Medicine

## 2014-01-05 DIAGNOSIS — F330 Major depressive disorder, recurrent, mild: Secondary | ICD-10-CM | POA: Diagnosis not present

## 2014-01-19 NOTE — Telephone Encounter (Signed)
Okay to close

## 2014-01-19 NOTE — Telephone Encounter (Signed)
Spoke with patient. Patient states that she checked on the Vagifem and the Estring and decided not to use these. Patient decided to use premarin. "It was 290 which is ridiculous but I bit the bullet and bought it. It is a great product but it is so expensive." Patient states she is currently using the premarin and will call back if she needs anything in the future.  Routing to provider for final review. Patient agreeable to disposition. Will close encounter

## 2014-02-10 DIAGNOSIS — F330 Major depressive disorder, recurrent, mild: Secondary | ICD-10-CM | POA: Diagnosis not present

## 2014-02-24 DIAGNOSIS — F330 Major depressive disorder, recurrent, mild: Secondary | ICD-10-CM | POA: Diagnosis not present

## 2014-03-02 DIAGNOSIS — Z23 Encounter for immunization: Secondary | ICD-10-CM | POA: Diagnosis not present

## 2014-03-26 DIAGNOSIS — F3341 Major depressive disorder, recurrent, in partial remission: Secondary | ICD-10-CM | POA: Diagnosis not present

## 2014-04-06 ENCOUNTER — Encounter: Payer: Self-pay | Admitting: Internal Medicine

## 2014-04-23 IMAGING — US US ABDOMEN COMPLETE
1 series · 14 of 25 positions shown · non-contrast
Comparison: None

CLINICAL DATA: Dyspepsia

ABDOMINAL ULTRASOUND COMPLETE

[Series 1: us abdomen complete · 0.22mm/px · 14 of 76 slices shown]
[im 1/76]
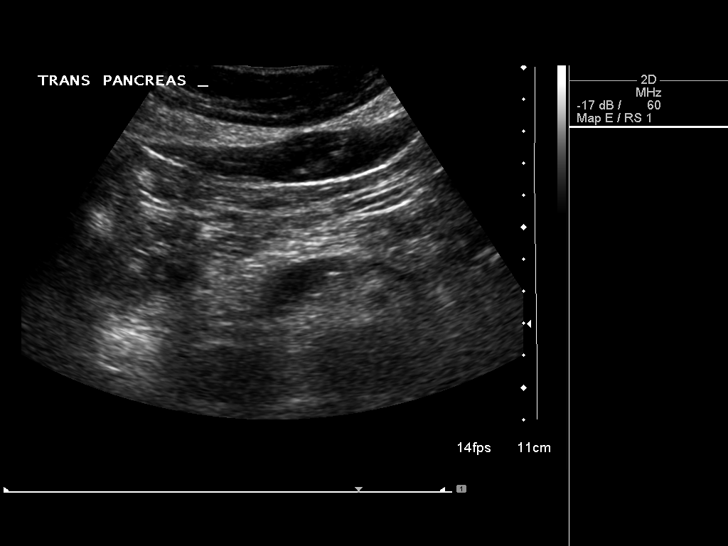
[im 7/76]
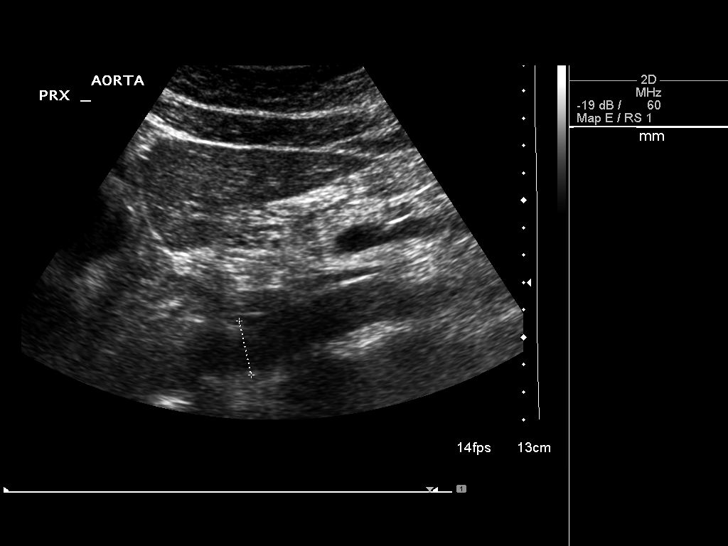
[im 13/76]
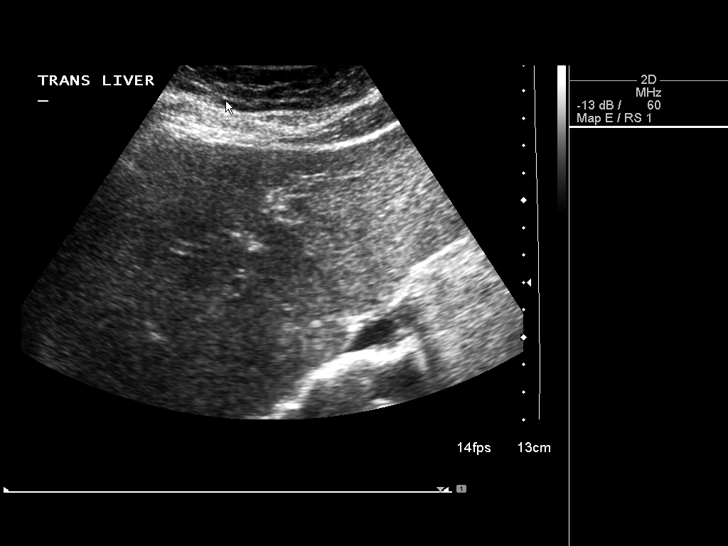
[im 19/76]
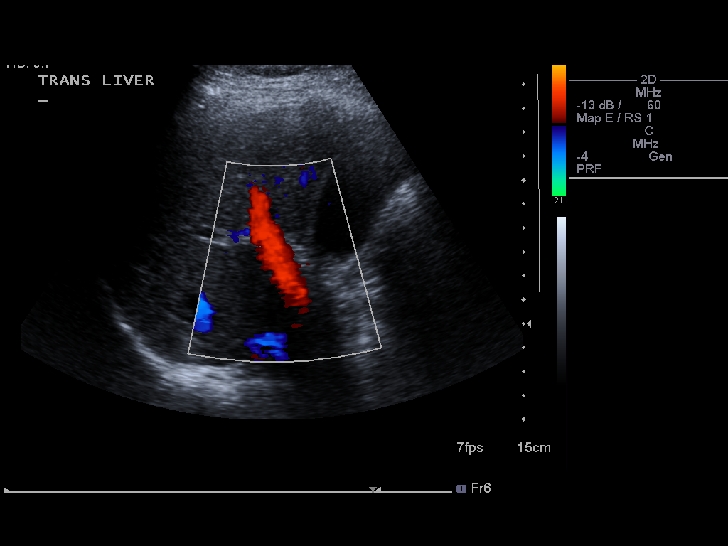
[im 26/76]
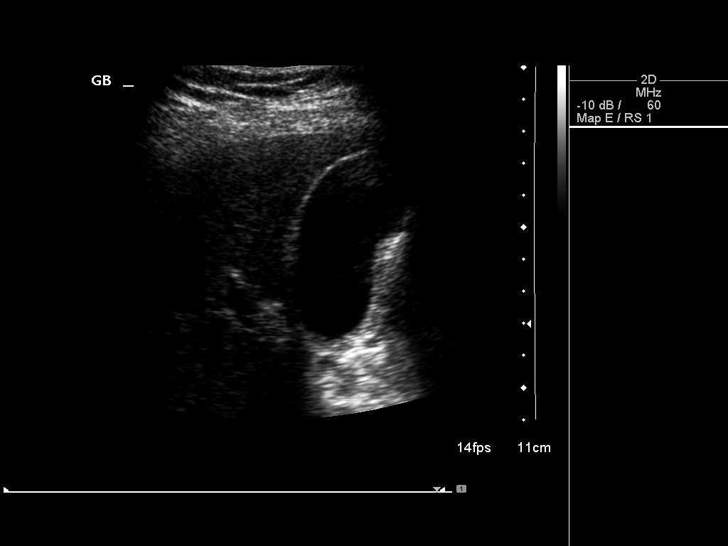
[im 29/76]
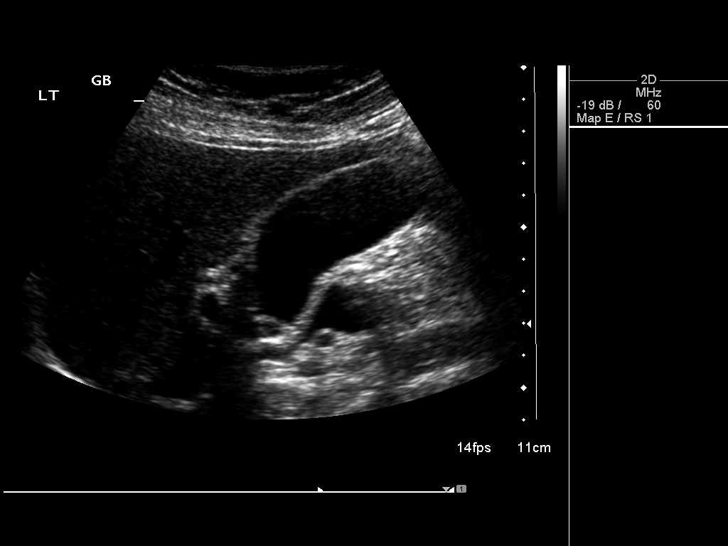
[im 35/76]
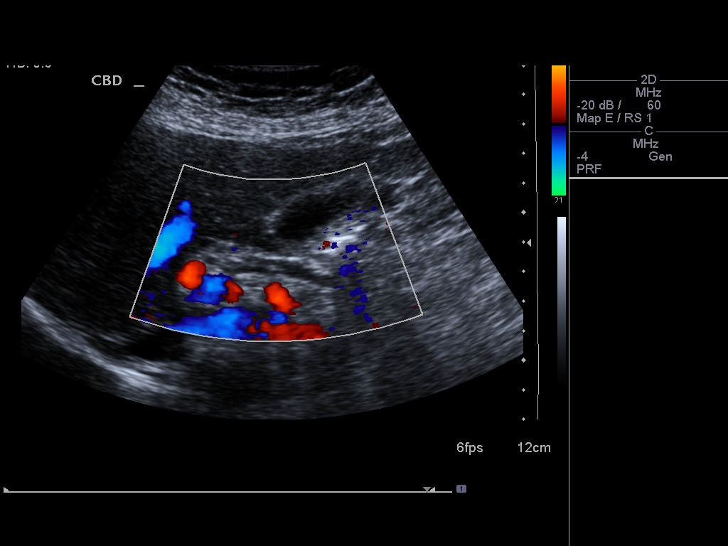
[im 41/76]
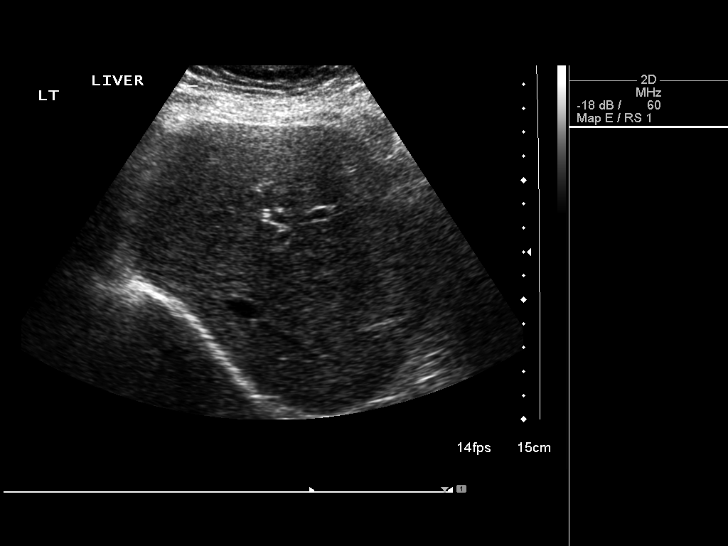
[im 47/76]
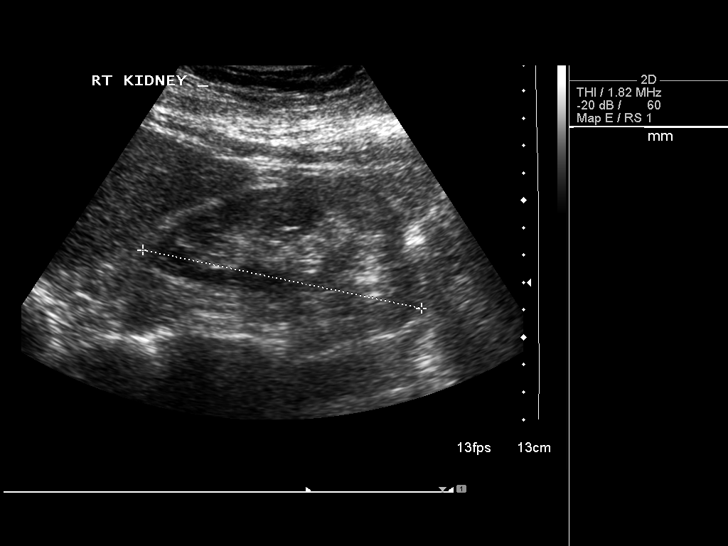
[im 51/76]
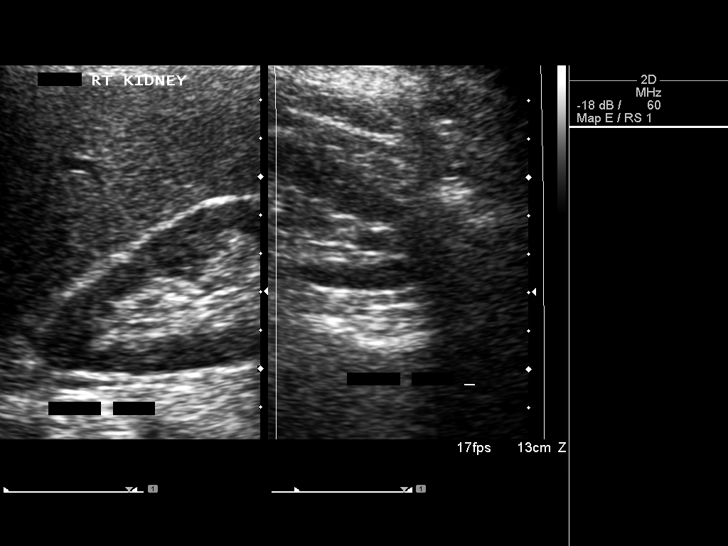
[im 57/76]
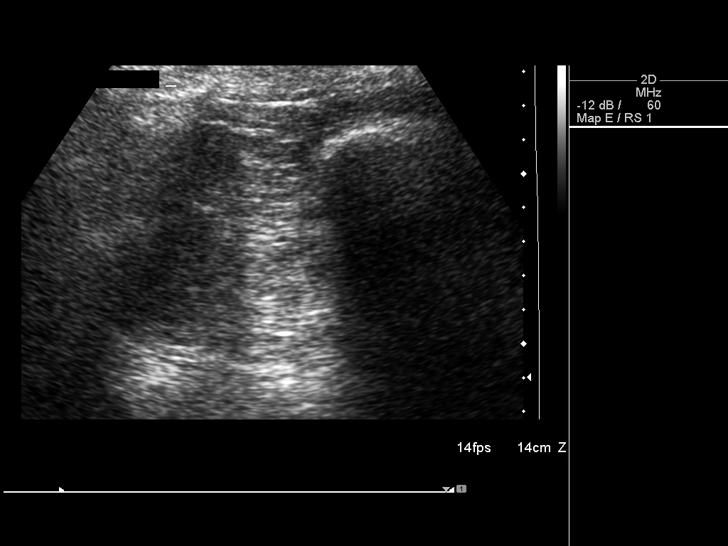
[im 63/76]
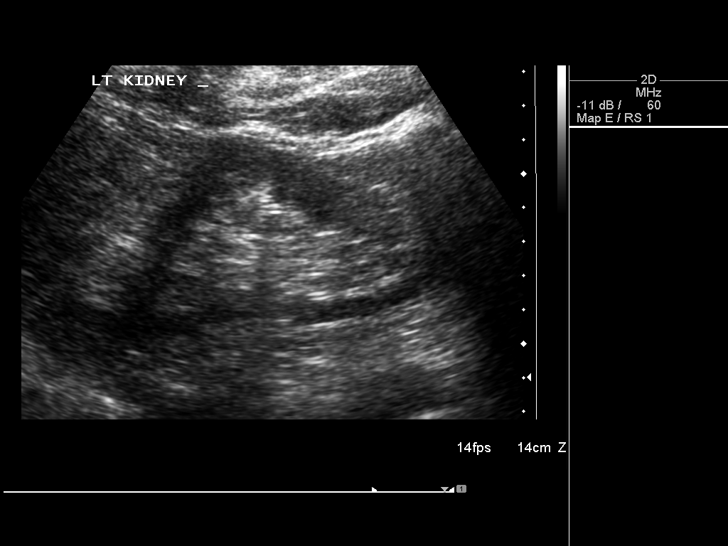
[im 69/76]
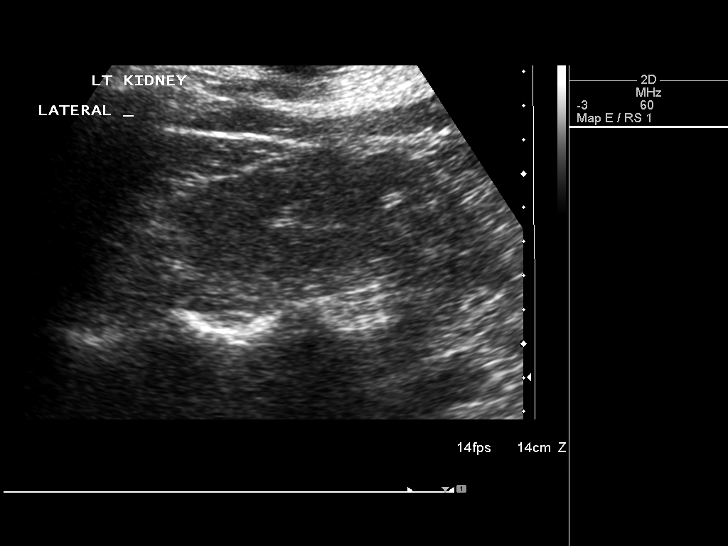
[im 76/76]
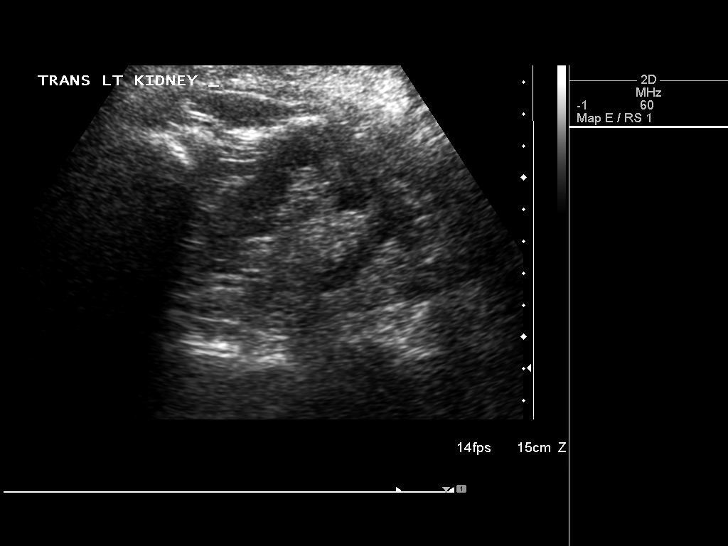

[14 of 25 positions shown; findings below may reference images not displayed]

FINDINGS: Gallbladder:  No gallstones, gallbladder wall thickening, or
pericholecystic fluid.

Common Bile Duct:  Within normal limits in caliber.

Liver: No focal mass lesion identified.  Within normal limits in
parenchymal echogenicity.

IVC:  The visualized portions appear normal.

Pancreas:  No abnormality identified.

Spleen:  Within normal limits in size and echotexture.

Right kidney:  Normal in size and parenchymal echogenicity.  No
evidence of mass or hydronephrosis.

Left kidney:  Normal in size and parenchymal echogenicity.  No
evidence of mass or hydronephrosis.

Abdominal Aorta:  No aneurysm identified.
IMPRESSION: 1.  No acute findings.

## 2014-04-29 DIAGNOSIS — R1013 Epigastric pain: Secondary | ICD-10-CM | POA: Diagnosis not present

## 2014-04-29 DIAGNOSIS — Z6827 Body mass index (BMI) 27.0-27.9, adult: Secondary | ICD-10-CM | POA: Diagnosis not present

## 2014-04-29 DIAGNOSIS — F418 Other specified anxiety disorders: Secondary | ICD-10-CM | POA: Diagnosis not present

## 2014-04-29 DIAGNOSIS — R140 Abdominal distension (gaseous): Secondary | ICD-10-CM | POA: Diagnosis not present

## 2014-05-18 ENCOUNTER — Other Ambulatory Visit: Payer: Self-pay | Admitting: Internal Medicine

## 2014-05-18 DIAGNOSIS — R1013 Epigastric pain: Secondary | ICD-10-CM

## 2014-05-22 ENCOUNTER — Ambulatory Visit
Admission: RE | Admit: 2014-05-22 | Discharge: 2014-05-22 | Disposition: A | Discharge status: Home / Self Care | Payer: Medicare Other | Source: Ambulatory Visit | Attending: Internal Medicine | Admitting: Internal Medicine

## 2014-05-22 DIAGNOSIS — N281 Cyst of kidney, acquired: Secondary | ICD-10-CM | POA: Diagnosis not present

## 2014-05-22 DIAGNOSIS — R1013 Epigastric pain: Secondary | ICD-10-CM

## 2014-05-22 DIAGNOSIS — R109 Unspecified abdominal pain: Secondary | ICD-10-CM | POA: Diagnosis not present

## 2014-05-22 MED ORDER — IOHEXOL 300 MG/ML  SOLN
100.0000 mL | Freq: Once | INTRAMUSCULAR | Status: AC | PRN
  Administered 2014-05-22: 100 mL via INTRAVENOUS

## 2014-06-23 DIAGNOSIS — M25561 Pain in right knee: Secondary | ICD-10-CM | POA: Diagnosis not present

## 2014-06-23 DIAGNOSIS — M7989 Other specified soft tissue disorders: Secondary | ICD-10-CM | POA: Diagnosis not present

## 2014-06-23 DIAGNOSIS — M1990 Unspecified osteoarthritis, unspecified site: Secondary | ICD-10-CM | POA: Diagnosis not present

## 2014-06-23 DIAGNOSIS — S8991XA Unspecified injury of right lower leg, initial encounter: Secondary | ICD-10-CM | POA: Diagnosis not present

## 2014-06-23 DIAGNOSIS — M1711 Unilateral primary osteoarthritis, right knee: Secondary | ICD-10-CM | POA: Diagnosis not present

## 2014-06-23 DIAGNOSIS — Z6827 Body mass index (BMI) 27.0-27.9, adult: Secondary | ICD-10-CM | POA: Diagnosis not present

## 2014-06-24 DIAGNOSIS — F3341 Major depressive disorder, recurrent, in partial remission: Secondary | ICD-10-CM | POA: Diagnosis not present

## 2014-08-10 DIAGNOSIS — F3341 Major depressive disorder, recurrent, in partial remission: Secondary | ICD-10-CM | POA: Diagnosis not present

## 2014-09-07 DIAGNOSIS — F3341 Major depressive disorder, recurrent, in partial remission: Secondary | ICD-10-CM | POA: Diagnosis not present

## 2014-09-29 DIAGNOSIS — F3341 Major depressive disorder, recurrent, in partial remission: Secondary | ICD-10-CM | POA: Diagnosis not present

## 2014-11-16 ENCOUNTER — Ambulatory Visit: Payer: Medicare Other | Admitting: Nurse Practitioner

## 2014-11-17 ENCOUNTER — Encounter: Payer: Self-pay | Admitting: Nurse Practitioner

## 2014-11-17 ENCOUNTER — Ambulatory Visit (INDEPENDENT_AMBULATORY_CARE_PROVIDER_SITE_OTHER): Payer: Medicare Other | Admitting: Nurse Practitioner

## 2014-11-17 VITALS — BP 118/68 | HR 68 | Ht 67.0 in | Wt 173.0 lb

## 2014-11-17 DIAGNOSIS — Z0000 Encounter for general adult medical examination without abnormal findings: Secondary | ICD-10-CM

## 2014-11-17 DIAGNOSIS — Z01419 Encounter for gynecological examination (general) (routine) without abnormal findings: Secondary | ICD-10-CM | POA: Diagnosis not present

## 2014-11-17 MED ORDER — ESTROGENS, CONJUGATED 0.625 MG/GM VA CREA: TOPICAL_CREAM | VAGINAL | Status: DC
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
## 2014-11-17 NOTE — Patient Instructions (Addendum)

## 2014-11-18 LAB — IPS PAP SMEAR ONLY

## 2014-11-19 NOTE — Progress Notes (Signed)
Reviewed personally.  M. Suzanne Kostantinos Tallman, MD.  

## 2014-11-30 ENCOUNTER — Other Ambulatory Visit: Payer: Self-pay

## 2014-12-08 DIAGNOSIS — N390 Urinary tract infection, site not specified: Secondary | ICD-10-CM | POA: Diagnosis not present

## 2014-12-08 DIAGNOSIS — M810 Age-related osteoporosis without current pathological fracture: Secondary | ICD-10-CM | POA: Diagnosis not present

## 2014-12-08 DIAGNOSIS — R8290 Unspecified abnormal findings in urine: Secondary | ICD-10-CM | POA: Diagnosis not present

## 2014-12-08 DIAGNOSIS — Z79899 Other long term (current) drug therapy: Secondary | ICD-10-CM | POA: Diagnosis not present

## 2014-12-08 DIAGNOSIS — E785 Hyperlipidemia, unspecified: Secondary | ICD-10-CM | POA: Diagnosis not present

## 2014-12-15 DIAGNOSIS — Z1389 Encounter for screening for other disorder: Secondary | ICD-10-CM | POA: Diagnosis not present

## 2014-12-15 DIAGNOSIS — K5900 Constipation, unspecified: Secondary | ICD-10-CM | POA: Diagnosis not present

## 2014-12-15 DIAGNOSIS — M1990 Unspecified osteoarthritis, unspecified site: Secondary | ICD-10-CM | POA: Diagnosis not present

## 2014-12-15 DIAGNOSIS — K30 Functional dyspepsia: Secondary | ICD-10-CM | POA: Diagnosis not present

## 2014-12-15 DIAGNOSIS — Z0000 Encounter for general adult medical examination without abnormal findings: Secondary | ICD-10-CM | POA: Diagnosis not present

## 2014-12-15 DIAGNOSIS — M25561 Pain in right knee: Secondary | ICD-10-CM | POA: Diagnosis not present

## 2014-12-15 DIAGNOSIS — I700 Atherosclerosis of aorta: Secondary | ICD-10-CM | POA: Diagnosis not present

## 2014-12-15 DIAGNOSIS — R140 Abdominal distension (gaseous): Secondary | ICD-10-CM | POA: Diagnosis not present

## 2014-12-15 DIAGNOSIS — Z23 Encounter for immunization: Secondary | ICD-10-CM | POA: Diagnosis not present

## 2014-12-16 DIAGNOSIS — Z1212 Encounter for screening for malignant neoplasm of rectum: Secondary | ICD-10-CM | POA: Diagnosis not present

## 2014-12-18 DIAGNOSIS — H524 Presbyopia: Secondary | ICD-10-CM | POA: Diagnosis not present

## 2014-12-18 DIAGNOSIS — H3531 Nonexudative age-related macular degeneration: Secondary | ICD-10-CM | POA: Diagnosis not present

## 2014-12-21 DIAGNOSIS — T50Z95A Adverse effect of other vaccines and biological substances, initial encounter: Secondary | ICD-10-CM | POA: Diagnosis not present

## 2014-12-28 DIAGNOSIS — F3341 Major depressive disorder, recurrent, in partial remission: Secondary | ICD-10-CM | POA: Diagnosis not present

## 2014-12-31 DIAGNOSIS — Z1231 Encounter for screening mammogram for malignant neoplasm of breast: Secondary | ICD-10-CM | POA: Diagnosis not present

## 2014-12-31 DIAGNOSIS — Z8262 Family history of osteoporosis: Secondary | ICD-10-CM | POA: Diagnosis not present

## 2015-04-07 DIAGNOSIS — F3341 Major depressive disorder, recurrent, in partial remission: Secondary | ICD-10-CM | POA: Diagnosis not present

## 2015-05-04 DIAGNOSIS — F3341 Major depressive disorder, recurrent, in partial remission: Secondary | ICD-10-CM | POA: Diagnosis not present

## 2015-05-26 DIAGNOSIS — F3341 Major depressive disorder, recurrent, in partial remission: Secondary | ICD-10-CM | POA: Diagnosis not present

## 2015-06-18 DIAGNOSIS — J018 Other acute sinusitis: Secondary | ICD-10-CM | POA: Diagnosis not present

## 2015-06-18 DIAGNOSIS — J069 Acute upper respiratory infection, unspecified: Secondary | ICD-10-CM | POA: Diagnosis not present

## 2015-06-18 DIAGNOSIS — R05 Cough: Secondary | ICD-10-CM | POA: Diagnosis not present

## 2015-06-29 DIAGNOSIS — F3341 Major depressive disorder, recurrent, in partial remission: Secondary | ICD-10-CM | POA: Diagnosis not present

## 2015-07-06 DIAGNOSIS — F3341 Major depressive disorder, recurrent, in partial remission: Secondary | ICD-10-CM | POA: Diagnosis not present

## 2015-07-20 DIAGNOSIS — F3341 Major depressive disorder, recurrent, in partial remission: Secondary | ICD-10-CM | POA: Diagnosis not present

## 2015-08-10 DIAGNOSIS — F3341 Major depressive disorder, recurrent, in partial remission: Secondary | ICD-10-CM | POA: Diagnosis not present

## 2015-08-24 DIAGNOSIS — F4321 Adjustment disorder with depressed mood: Secondary | ICD-10-CM | POA: Diagnosis not present

## 2015-08-31 DIAGNOSIS — F3341 Major depressive disorder, recurrent, in partial remission: Secondary | ICD-10-CM | POA: Diagnosis not present

## 2015-09-22 DIAGNOSIS — F3341 Major depressive disorder, recurrent, in partial remission: Secondary | ICD-10-CM | POA: Diagnosis not present

## 2015-10-11 DIAGNOSIS — H353131 Nonexudative age-related macular degeneration, bilateral, early dry stage: Secondary | ICD-10-CM | POA: Diagnosis not present

## 2015-10-11 DIAGNOSIS — H524 Presbyopia: Secondary | ICD-10-CM | POA: Diagnosis not present

## 2015-10-11 DIAGNOSIS — H2513 Age-related nuclear cataract, bilateral: Secondary | ICD-10-CM | POA: Diagnosis not present

## 2015-10-11 DIAGNOSIS — I10 Essential (primary) hypertension: Secondary | ICD-10-CM | POA: Diagnosis not present

## 2015-10-20 DIAGNOSIS — F3341 Major depressive disorder, recurrent, in partial remission: Secondary | ICD-10-CM | POA: Diagnosis not present

## 2015-11-09 DIAGNOSIS — F3341 Major depressive disorder, recurrent, in partial remission: Secondary | ICD-10-CM | POA: Diagnosis not present

## 2015-11-10 DIAGNOSIS — L820 Inflamed seborrheic keratosis: Secondary | ICD-10-CM | POA: Diagnosis not present

## 2015-11-10 DIAGNOSIS — I788 Other diseases of capillaries: Secondary | ICD-10-CM | POA: Diagnosis not present

## 2015-11-10 DIAGNOSIS — L570 Actinic keratosis: Secondary | ICD-10-CM | POA: Diagnosis not present

## 2015-11-23 DIAGNOSIS — F331 Major depressive disorder, recurrent, moderate: Secondary | ICD-10-CM | POA: Diagnosis not present

## 2015-11-26 ENCOUNTER — Ambulatory Visit: Payer: Medicare Other | Admitting: Nurse Practitioner

## 2015-11-30 ENCOUNTER — Ambulatory Visit: Payer: Medicare Other | Admitting: Nurse Practitioner

## 2015-11-30 DIAGNOSIS — F331 Major depressive disorder, recurrent, moderate: Secondary | ICD-10-CM | POA: Diagnosis not present

## 2015-12-01 ENCOUNTER — Encounter: Payer: Self-pay | Admitting: Nurse Practitioner

## 2015-12-01 ENCOUNTER — Ambulatory Visit (INDEPENDENT_AMBULATORY_CARE_PROVIDER_SITE_OTHER): Payer: Medicare Other | Admitting: Nurse Practitioner

## 2015-12-01 VITALS — BP 132/72 | HR 72 | Ht 67.25 in | Wt 171.0 lb

## 2015-12-01 DIAGNOSIS — Z0000 Encounter for general adult medical examination without abnormal findings: Secondary | ICD-10-CM | POA: Diagnosis not present

## 2015-12-01 DIAGNOSIS — N952 Postmenopausal atrophic vaginitis: Secondary | ICD-10-CM | POA: Diagnosis not present

## 2015-12-01 DIAGNOSIS — Z124 Encounter for screening for malignant neoplasm of cervix: Secondary | ICD-10-CM | POA: Diagnosis not present

## 2015-12-01 DIAGNOSIS — Z01419 Encounter for gynecological examination (general) (routine) without abnormal findings: Secondary | ICD-10-CM | POA: Diagnosis not present

## 2015-12-01 DIAGNOSIS — F4323 Adjustment disorder with mixed anxiety and depressed mood: Secondary | ICD-10-CM | POA: Diagnosis not present

## 2015-12-01 DIAGNOSIS — Z205 Contact with and (suspected) exposure to viral hepatitis: Secondary | ICD-10-CM
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
## 2015-12-01 NOTE — Progress Notes (Signed)
Encounter reviewed by Dr. Brook Amundson C. Silva.  

## 2015-12-01 NOTE — Patient Instructions (Addendum)

## 2015-12-02 LAB — HEPATITIS C ANTIBODY: HCV AB: NEGATIVE

## 2015-12-14 ENCOUNTER — Telehealth: Payer: Self-pay | Admitting: Nurse Practitioner

## 2015-12-15 NOTE — Telephone Encounter (Addendum)
In error was opened.

## 2015-12-17 ENCOUNTER — Telehealth: Payer: Self-pay | Admitting: Nurse Practitioner

## 2015-12-17 NOTE — Telephone Encounter (Signed)
Please let pt know that BMD done 12/31/14 shows a T Score at spine -1.2; right hip -1.5, left hip -1.4.  She falls in the upper osteopenic range.  Comparison to BMD done 11/27/12 there was an increase at the spine which could also be from degenerative changes.   Follow Dr. Keane Police directions for a repeat in 2018 or 2019.  Also got results of Mammo 12/31/14 needs to repeat 01/2016.

## 2015-12-17 NOTE — Telephone Encounter (Signed)
Patient notified of results.  She is happy with results.  Encouraged to continue with Dr. Keane Police recommendations regarding repeating exam.  Discussed weight bearing exercise and calcium and Vit D supplement.  Patient is agreeable with this plan.  Patient very thankful that we requested these results and discussed them with her.

## 2015-12-21 DIAGNOSIS — F331 Major depressive disorder, recurrent, moderate: Secondary | ICD-10-CM | POA: Diagnosis not present

## 2015-12-23 DIAGNOSIS — Z79899 Other long term (current) drug therapy: Secondary | ICD-10-CM | POA: Diagnosis not present

## 2015-12-23 DIAGNOSIS — M810 Age-related osteoporosis without current pathological fracture: Secondary | ICD-10-CM | POA: Diagnosis not present

## 2015-12-23 DIAGNOSIS — E784 Other hyperlipidemia: Secondary | ICD-10-CM | POA: Diagnosis not present

## 2015-12-30 DIAGNOSIS — E784 Other hyperlipidemia: Secondary | ICD-10-CM | POA: Diagnosis not present

## 2015-12-30 DIAGNOSIS — Z1389 Encounter for screening for other disorder: Secondary | ICD-10-CM | POA: Diagnosis not present

## 2015-12-30 DIAGNOSIS — K5909 Other constipation: Secondary | ICD-10-CM | POA: Diagnosis not present

## 2015-12-30 DIAGNOSIS — R140 Abdominal distension (gaseous): Secondary | ICD-10-CM | POA: Diagnosis not present

## 2015-12-30 DIAGNOSIS — Z6826 Body mass index (BMI) 26.0-26.9, adult: Secondary | ICD-10-CM | POA: Diagnosis not present

## 2015-12-30 DIAGNOSIS — M47819 Spondylosis without myelopathy or radiculopathy, site unspecified: Secondary | ICD-10-CM | POA: Diagnosis not present

## 2015-12-30 DIAGNOSIS — M5134 Other intervertebral disc degeneration, thoracic region: Secondary | ICD-10-CM | POA: Diagnosis not present

## 2015-12-30 DIAGNOSIS — K219 Gastro-esophageal reflux disease without esophagitis: Secondary | ICD-10-CM | POA: Diagnosis not present

## 2015-12-30 DIAGNOSIS — I872 Venous insufficiency (chronic) (peripheral): Secondary | ICD-10-CM | POA: Diagnosis not present

## 2015-12-30 DIAGNOSIS — N6019 Diffuse cystic mastopathy of unspecified breast: Secondary | ICD-10-CM | POA: Diagnosis not present

## 2015-12-30 DIAGNOSIS — Z0000 Encounter for general adult medical examination without abnormal findings: Secondary | ICD-10-CM | POA: Diagnosis not present

## 2015-12-30 DIAGNOSIS — I700 Atherosclerosis of aorta: Secondary | ICD-10-CM | POA: Diagnosis not present

## 2015-12-30 DIAGNOSIS — M549 Dorsalgia, unspecified: Secondary | ICD-10-CM | POA: Diagnosis not present

## 2015-12-30 DIAGNOSIS — F325 Major depressive disorder, single episode, in full remission: Secondary | ICD-10-CM | POA: Diagnosis not present

## 2015-12-31 DIAGNOSIS — Z1212 Encounter for screening for malignant neoplasm of rectum: Secondary | ICD-10-CM | POA: Diagnosis not present

## 2016-01-03 DIAGNOSIS — Z803 Family history of malignant neoplasm of breast: Secondary | ICD-10-CM | POA: Diagnosis not present

## 2016-01-03 DIAGNOSIS — Z1231 Encounter for screening mammogram for malignant neoplasm of breast: Secondary | ICD-10-CM | POA: Diagnosis not present

## 2016-01-06 DIAGNOSIS — M9901 Segmental and somatic dysfunction of cervical region: Secondary | ICD-10-CM | POA: Diagnosis not present

## 2016-01-06 DIAGNOSIS — M5136 Other intervertebral disc degeneration, lumbar region: Secondary | ICD-10-CM | POA: Diagnosis not present

## 2016-01-06 DIAGNOSIS — M9903 Segmental and somatic dysfunction of lumbar region: Secondary | ICD-10-CM | POA: Diagnosis not present

## 2016-01-06 DIAGNOSIS — M9902 Segmental and somatic dysfunction of thoracic region: Secondary | ICD-10-CM | POA: Diagnosis not present

## 2016-01-06 DIAGNOSIS — M5134 Other intervertebral disc degeneration, thoracic region: Secondary | ICD-10-CM | POA: Diagnosis not present

## 2016-01-06 DIAGNOSIS — M5032 Other cervical disc degeneration, mid-cervical region, unspecified level: Secondary | ICD-10-CM | POA: Diagnosis not present

## 2016-01-07 DIAGNOSIS — M47812 Spondylosis without myelopathy or radiculopathy, cervical region: Secondary | ICD-10-CM | POA: Diagnosis not present

## 2016-01-07 DIAGNOSIS — M50321 Other cervical disc degeneration at C4-C5 level: Secondary | ICD-10-CM | POA: Diagnosis not present

## 2016-01-10 DIAGNOSIS — M9902 Segmental and somatic dysfunction of thoracic region: Secondary | ICD-10-CM | POA: Diagnosis not present

## 2016-01-10 DIAGNOSIS — M5032 Other cervical disc degeneration, mid-cervical region, unspecified level: Secondary | ICD-10-CM | POA: Diagnosis not present

## 2016-01-10 DIAGNOSIS — M5136 Other intervertebral disc degeneration, lumbar region: Secondary | ICD-10-CM | POA: Diagnosis not present

## 2016-01-10 DIAGNOSIS — M5134 Other intervertebral disc degeneration, thoracic region: Secondary | ICD-10-CM | POA: Diagnosis not present

## 2016-01-10 DIAGNOSIS — M9903 Segmental and somatic dysfunction of lumbar region: Secondary | ICD-10-CM | POA: Diagnosis not present

## 2016-01-10 DIAGNOSIS — M9901 Segmental and somatic dysfunction of cervical region: Secondary | ICD-10-CM | POA: Diagnosis not present

## 2016-01-11 DIAGNOSIS — F330 Major depressive disorder, recurrent, mild: Secondary | ICD-10-CM | POA: Diagnosis not present

## 2016-01-13 DIAGNOSIS — M5134 Other intervertebral disc degeneration, thoracic region: Secondary | ICD-10-CM | POA: Diagnosis not present

## 2016-01-13 DIAGNOSIS — M5136 Other intervertebral disc degeneration, lumbar region: Secondary | ICD-10-CM | POA: Diagnosis not present

## 2016-01-13 DIAGNOSIS — M9901 Segmental and somatic dysfunction of cervical region: Secondary | ICD-10-CM | POA: Diagnosis not present

## 2016-01-13 DIAGNOSIS — M5032 Other cervical disc degeneration, mid-cervical region, unspecified level: Secondary | ICD-10-CM | POA: Diagnosis not present

## 2016-01-13 DIAGNOSIS — M9902 Segmental and somatic dysfunction of thoracic region: Secondary | ICD-10-CM | POA: Diagnosis not present

## 2016-01-13 DIAGNOSIS — M9903 Segmental and somatic dysfunction of lumbar region: Secondary | ICD-10-CM | POA: Diagnosis not present

## 2016-01-17 DIAGNOSIS — M5032 Other cervical disc degeneration, mid-cervical region, unspecified level: Secondary | ICD-10-CM | POA: Diagnosis not present

## 2016-01-17 DIAGNOSIS — M9903 Segmental and somatic dysfunction of lumbar region: Secondary | ICD-10-CM | POA: Diagnosis not present

## 2016-01-17 DIAGNOSIS — M5134 Other intervertebral disc degeneration, thoracic region: Secondary | ICD-10-CM | POA: Diagnosis not present

## 2016-01-17 DIAGNOSIS — M5136 Other intervertebral disc degeneration, lumbar region: Secondary | ICD-10-CM | POA: Diagnosis not present

## 2016-01-17 DIAGNOSIS — M9902 Segmental and somatic dysfunction of thoracic region: Secondary | ICD-10-CM | POA: Diagnosis not present

## 2016-01-17 DIAGNOSIS — M9901 Segmental and somatic dysfunction of cervical region: Secondary | ICD-10-CM | POA: Diagnosis not present

## 2016-01-20 DIAGNOSIS — M9901 Segmental and somatic dysfunction of cervical region: Secondary | ICD-10-CM | POA: Diagnosis not present

## 2016-01-20 DIAGNOSIS — M5134 Other intervertebral disc degeneration, thoracic region: Secondary | ICD-10-CM | POA: Diagnosis not present

## 2016-01-20 DIAGNOSIS — M9903 Segmental and somatic dysfunction of lumbar region: Secondary | ICD-10-CM | POA: Diagnosis not present

## 2016-01-20 DIAGNOSIS — M5032 Other cervical disc degeneration, mid-cervical region, unspecified level: Secondary | ICD-10-CM | POA: Diagnosis not present

## 2016-01-20 DIAGNOSIS — M5136 Other intervertebral disc degeneration, lumbar region: Secondary | ICD-10-CM | POA: Diagnosis not present

## 2016-01-20 DIAGNOSIS — M9902 Segmental and somatic dysfunction of thoracic region: Secondary | ICD-10-CM | POA: Diagnosis not present

## 2016-01-24 DIAGNOSIS — F331 Major depressive disorder, recurrent, moderate: Secondary | ICD-10-CM | POA: Diagnosis not present

## 2016-01-25 DIAGNOSIS — M9903 Segmental and somatic dysfunction of lumbar region: Secondary | ICD-10-CM | POA: Diagnosis not present

## 2016-01-25 DIAGNOSIS — M5032 Other cervical disc degeneration, mid-cervical region, unspecified level: Secondary | ICD-10-CM | POA: Diagnosis not present

## 2016-01-25 DIAGNOSIS — M5136 Other intervertebral disc degeneration, lumbar region: Secondary | ICD-10-CM | POA: Diagnosis not present

## 2016-01-25 DIAGNOSIS — M9901 Segmental and somatic dysfunction of cervical region: Secondary | ICD-10-CM | POA: Diagnosis not present

## 2016-01-25 DIAGNOSIS — M5134 Other intervertebral disc degeneration, thoracic region: Secondary | ICD-10-CM | POA: Diagnosis not present

## 2016-01-25 DIAGNOSIS — M9902 Segmental and somatic dysfunction of thoracic region: Secondary | ICD-10-CM | POA: Diagnosis not present

## 2016-02-01 DIAGNOSIS — M9903 Segmental and somatic dysfunction of lumbar region: Secondary | ICD-10-CM | POA: Diagnosis not present

## 2016-02-01 DIAGNOSIS — M9901 Segmental and somatic dysfunction of cervical region: Secondary | ICD-10-CM | POA: Diagnosis not present

## 2016-02-01 DIAGNOSIS — M9902 Segmental and somatic dysfunction of thoracic region: Secondary | ICD-10-CM | POA: Diagnosis not present

## 2016-02-01 DIAGNOSIS — M5134 Other intervertebral disc degeneration, thoracic region: Secondary | ICD-10-CM | POA: Diagnosis not present

## 2016-02-01 DIAGNOSIS — M5032 Other cervical disc degeneration, mid-cervical region, unspecified level: Secondary | ICD-10-CM | POA: Diagnosis not present

## 2016-02-01 DIAGNOSIS — F330 Major depressive disorder, recurrent, mild: Secondary | ICD-10-CM | POA: Diagnosis not present

## 2016-02-01 DIAGNOSIS — M5136 Other intervertebral disc degeneration, lumbar region: Secondary | ICD-10-CM | POA: Diagnosis not present

## 2016-02-03 DIAGNOSIS — M5136 Other intervertebral disc degeneration, lumbar region: Secondary | ICD-10-CM | POA: Diagnosis not present

## 2016-02-03 DIAGNOSIS — M9901 Segmental and somatic dysfunction of cervical region: Secondary | ICD-10-CM | POA: Diagnosis not present

## 2016-02-03 DIAGNOSIS — M9902 Segmental and somatic dysfunction of thoracic region: Secondary | ICD-10-CM | POA: Diagnosis not present

## 2016-02-03 DIAGNOSIS — M9903 Segmental and somatic dysfunction of lumbar region: Secondary | ICD-10-CM | POA: Diagnosis not present

## 2016-02-03 DIAGNOSIS — M5134 Other intervertebral disc degeneration, thoracic region: Secondary | ICD-10-CM | POA: Diagnosis not present

## 2016-02-03 DIAGNOSIS — M5032 Other cervical disc degeneration, mid-cervical region, unspecified level: Secondary | ICD-10-CM | POA: Diagnosis not present

## 2016-02-10 DIAGNOSIS — F325 Major depressive disorder, single episode, in full remission: Secondary | ICD-10-CM | POA: Diagnosis not present

## 2016-02-10 DIAGNOSIS — Z6826 Body mass index (BMI) 26.0-26.9, adult: Secondary | ICD-10-CM | POA: Diagnosis not present

## 2016-02-10 DIAGNOSIS — M1990 Unspecified osteoarthritis, unspecified site: Secondary | ICD-10-CM | POA: Diagnosis not present

## 2016-02-17 DIAGNOSIS — F332 Major depressive disorder, recurrent severe without psychotic features: Secondary | ICD-10-CM | POA: Diagnosis not present

## 2016-02-17 DIAGNOSIS — F330 Major depressive disorder, recurrent, mild: Secondary | ICD-10-CM | POA: Diagnosis not present

## 2016-02-23 DIAGNOSIS — L603 Nail dystrophy: Secondary | ICD-10-CM | POA: Diagnosis not present

## 2016-02-23 DIAGNOSIS — L602 Onychogryphosis: Secondary | ICD-10-CM | POA: Diagnosis not present

## 2016-02-23 DIAGNOSIS — L03031 Cellulitis of right toe: Secondary | ICD-10-CM | POA: Diagnosis not present

## 2016-03-01 DIAGNOSIS — Z23 Encounter for immunization: Secondary | ICD-10-CM | POA: Diagnosis not present

## 2016-03-03 DIAGNOSIS — L603 Nail dystrophy: Secondary | ICD-10-CM | POA: Diagnosis not present

## 2016-03-14 DIAGNOSIS — F331 Major depressive disorder, recurrent, moderate: Secondary | ICD-10-CM | POA: Diagnosis not present

## 2016-03-30 DIAGNOSIS — M25552 Pain in left hip: Secondary | ICD-10-CM | POA: Diagnosis not present

## 2016-03-30 DIAGNOSIS — M1990 Unspecified osteoarthritis, unspecified site: Secondary | ICD-10-CM | POA: Diagnosis not present

## 2016-03-30 DIAGNOSIS — Z6825 Body mass index (BMI) 25.0-25.9, adult: Secondary | ICD-10-CM | POA: Diagnosis not present

## 2016-03-30 DIAGNOSIS — B351 Tinea unguium: Secondary | ICD-10-CM | POA: Diagnosis not present

## 2016-03-30 DIAGNOSIS — M8588 Other specified disorders of bone density and structure, other site: Secondary | ICD-10-CM | POA: Diagnosis not present

## 2016-03-30 DIAGNOSIS — M76892 Other specified enthesopathies of left lower limb, excluding foot: Secondary | ICD-10-CM | POA: Diagnosis not present

## 2016-03-30 DIAGNOSIS — M549 Dorsalgia, unspecified: Secondary | ICD-10-CM | POA: Diagnosis not present

## 2016-04-06 DIAGNOSIS — F330 Major depressive disorder, recurrent, mild: Secondary | ICD-10-CM | POA: Diagnosis not present

## 2016-04-11 DIAGNOSIS — F331 Major depressive disorder, recurrent, moderate: Secondary | ICD-10-CM | POA: Diagnosis not present

## 2016-04-24 DIAGNOSIS — F331 Major depressive disorder, recurrent, moderate: Secondary | ICD-10-CM | POA: Diagnosis not present

## 2016-05-04 ENCOUNTER — Telehealth: Payer: Self-pay | Admitting: Neurology

## 2016-05-05 NOTE — Telephone Encounter (Signed)
Completed.

## 2016-05-08 DIAGNOSIS — F330 Major depressive disorder, recurrent, mild: Secondary | ICD-10-CM | POA: Diagnosis not present

## 2016-05-09 DIAGNOSIS — Z6825 Body mass index (BMI) 25.0-25.9, adult: Secondary | ICD-10-CM | POA: Diagnosis not present

## 2016-05-09 DIAGNOSIS — K5909 Other constipation: Secondary | ICD-10-CM | POA: Diagnosis not present

## 2016-05-09 DIAGNOSIS — M25521 Pain in right elbow: Secondary | ICD-10-CM | POA: Diagnosis not present

## 2016-05-09 DIAGNOSIS — M25552 Pain in left hip: Secondary | ICD-10-CM | POA: Diagnosis not present

## 2016-05-09 DIAGNOSIS — F325 Major depressive disorder, single episode, in full remission: Secondary | ICD-10-CM | POA: Diagnosis not present

## 2016-05-23 DIAGNOSIS — M533 Sacrococcygeal disorders, not elsewhere classified: Secondary | ICD-10-CM | POA: Diagnosis not present

## 2016-05-23 DIAGNOSIS — M7062 Trochanteric bursitis, left hip: Secondary | ICD-10-CM | POA: Diagnosis not present

## 2016-05-24 DIAGNOSIS — F330 Major depressive disorder, recurrent, mild: Secondary | ICD-10-CM | POA: Diagnosis not present

## 2016-06-09 DIAGNOSIS — M25521 Pain in right elbow: Secondary | ICD-10-CM | POA: Diagnosis not present

## 2016-06-09 DIAGNOSIS — I700 Atherosclerosis of aorta: Secondary | ICD-10-CM | POA: Diagnosis not present

## 2016-06-09 DIAGNOSIS — M25552 Pain in left hip: Secondary | ICD-10-CM | POA: Diagnosis not present

## 2016-06-09 DIAGNOSIS — Z6825 Body mass index (BMI) 25.0-25.9, adult: Secondary | ICD-10-CM | POA: Diagnosis not present

## 2016-06-09 DIAGNOSIS — F325 Major depressive disorder, single episode, in full remission: Secondary | ICD-10-CM | POA: Diagnosis not present

## 2016-06-09 DIAGNOSIS — M810 Age-related osteoporosis without current pathological fracture: Secondary | ICD-10-CM | POA: Diagnosis not present

## 2016-06-13 DIAGNOSIS — F330 Major depressive disorder, recurrent, mild: Secondary | ICD-10-CM | POA: Diagnosis not present

## 2016-06-15 DIAGNOSIS — F330 Major depressive disorder, recurrent, mild: Secondary | ICD-10-CM | POA: Diagnosis not present

## 2016-07-11 DIAGNOSIS — F330 Major depressive disorder, recurrent, mild: Secondary | ICD-10-CM | POA: Diagnosis not present

## 2016-07-26 DIAGNOSIS — M5442 Lumbago with sciatica, left side: Secondary | ICD-10-CM | POA: Diagnosis not present

## 2016-07-26 DIAGNOSIS — M5136 Other intervertebral disc degeneration, lumbar region: Secondary | ICD-10-CM | POA: Diagnosis not present

## 2016-07-26 DIAGNOSIS — M7062 Trochanteric bursitis, left hip: Secondary | ICD-10-CM | POA: Diagnosis not present

## 2016-08-04 DIAGNOSIS — M7062 Trochanteric bursitis, left hip: Secondary | ICD-10-CM | POA: Diagnosis not present

## 2016-08-09 DIAGNOSIS — M5136 Other intervertebral disc degeneration, lumbar region: Secondary | ICD-10-CM | POA: Diagnosis not present

## 2016-08-09 DIAGNOSIS — M5416 Radiculopathy, lumbar region: Secondary | ICD-10-CM | POA: Diagnosis not present

## 2016-08-09 DIAGNOSIS — M7062 Trochanteric bursitis, left hip: Secondary | ICD-10-CM | POA: Diagnosis not present

## 2016-08-09 DIAGNOSIS — M5442 Lumbago with sciatica, left side: Secondary | ICD-10-CM | POA: Diagnosis not present

## 2016-08-17 DIAGNOSIS — M5442 Lumbago with sciatica, left side: Secondary | ICD-10-CM | POA: Diagnosis not present

## 2016-08-21 DIAGNOSIS — F330 Major depressive disorder, recurrent, mild: Secondary | ICD-10-CM | POA: Diagnosis not present

## 2016-08-22 DIAGNOSIS — M5442 Lumbago with sciatica, left side: Secondary | ICD-10-CM | POA: Diagnosis not present

## 2016-08-24 DIAGNOSIS — M5442 Lumbago with sciatica, left side: Secondary | ICD-10-CM | POA: Diagnosis not present

## 2016-08-29 DIAGNOSIS — M5442 Lumbago with sciatica, left side: Secondary | ICD-10-CM | POA: Diagnosis not present

## 2016-08-31 DIAGNOSIS — M5442 Lumbago with sciatica, left side: Secondary | ICD-10-CM | POA: Diagnosis not present

## 2016-09-07 DIAGNOSIS — M5442 Lumbago with sciatica, left side: Secondary | ICD-10-CM | POA: Diagnosis not present

## 2016-09-12 DIAGNOSIS — M5442 Lumbago with sciatica, left side: Secondary | ICD-10-CM | POA: Diagnosis not present

## 2016-09-14 DIAGNOSIS — F330 Major depressive disorder, recurrent, mild: Secondary | ICD-10-CM | POA: Diagnosis not present

## 2016-09-14 DIAGNOSIS — M5442 Lumbago with sciatica, left side: Secondary | ICD-10-CM | POA: Diagnosis not present

## 2016-09-18 DIAGNOSIS — M5442 Lumbago with sciatica, left side: Secondary | ICD-10-CM | POA: Diagnosis not present

## 2016-09-18 DIAGNOSIS — M5416 Radiculopathy, lumbar region: Secondary | ICD-10-CM | POA: Diagnosis not present

## 2016-09-18 DIAGNOSIS — M7062 Trochanteric bursitis, left hip: Secondary | ICD-10-CM | POA: Diagnosis not present

## 2016-09-18 DIAGNOSIS — M5136 Other intervertebral disc degeneration, lumbar region: Secondary | ICD-10-CM | POA: Diagnosis not present

## 2016-09-19 DIAGNOSIS — M5442 Lumbago with sciatica, left side: Secondary | ICD-10-CM | POA: Diagnosis not present

## 2016-09-21 DIAGNOSIS — M5442 Lumbago with sciatica, left side: Secondary | ICD-10-CM | POA: Diagnosis not present

## 2016-09-26 DIAGNOSIS — M5442 Lumbago with sciatica, left side: Secondary | ICD-10-CM | POA: Diagnosis not present

## 2016-09-28 DIAGNOSIS — M5442 Lumbago with sciatica, left side: Secondary | ICD-10-CM | POA: Diagnosis not present

## 2016-10-03 DIAGNOSIS — M5442 Lumbago with sciatica, left side: Secondary | ICD-10-CM | POA: Diagnosis not present

## 2016-10-04 DIAGNOSIS — F330 Major depressive disorder, recurrent, mild: Secondary | ICD-10-CM | POA: Diagnosis not present

## 2016-10-06 DIAGNOSIS — M5442 Lumbago with sciatica, left side: Secondary | ICD-10-CM | POA: Diagnosis not present

## 2016-10-10 DIAGNOSIS — H353130 Nonexudative age-related macular degeneration, bilateral, stage unspecified: Secondary | ICD-10-CM | POA: Diagnosis not present

## 2016-10-10 DIAGNOSIS — H52223 Regular astigmatism, bilateral: Secondary | ICD-10-CM | POA: Diagnosis not present

## 2016-10-10 DIAGNOSIS — H5203 Hypermetropia, bilateral: Secondary | ICD-10-CM | POA: Diagnosis not present

## 2016-10-10 DIAGNOSIS — H26493 Other secondary cataract, bilateral: Secondary | ICD-10-CM | POA: Diagnosis not present

## 2016-10-10 DIAGNOSIS — H2513 Age-related nuclear cataract, bilateral: Secondary | ICD-10-CM | POA: Diagnosis not present

## 2016-10-10 DIAGNOSIS — H524 Presbyopia: Secondary | ICD-10-CM | POA: Diagnosis not present

## 2016-10-17 DIAGNOSIS — M5442 Lumbago with sciatica, left side: Secondary | ICD-10-CM | POA: Diagnosis not present

## 2016-10-17 DIAGNOSIS — M5416 Radiculopathy, lumbar region: Secondary | ICD-10-CM | POA: Diagnosis not present

## 2016-10-17 DIAGNOSIS — M5136 Other intervertebral disc degeneration, lumbar region: Secondary | ICD-10-CM | POA: Diagnosis not present

## 2016-10-17 DIAGNOSIS — M7062 Trochanteric bursitis, left hip: Secondary | ICD-10-CM | POA: Diagnosis not present

## 2016-10-27 DIAGNOSIS — M545 Low back pain: Secondary | ICD-10-CM | POA: Diagnosis not present

## 2016-10-27 DIAGNOSIS — M7062 Trochanteric bursitis, left hip: Secondary | ICD-10-CM | POA: Diagnosis not present

## 2016-10-27 DIAGNOSIS — M5432 Sciatica, left side: Secondary | ICD-10-CM | POA: Diagnosis not present

## 2016-11-07 DIAGNOSIS — L570 Actinic keratosis: Secondary | ICD-10-CM | POA: Diagnosis not present

## 2016-11-07 DIAGNOSIS — D485 Neoplasm of uncertain behavior of skin: Secondary | ICD-10-CM | POA: Diagnosis not present

## 2016-11-07 DIAGNOSIS — L821 Other seborrheic keratosis: Secondary | ICD-10-CM | POA: Diagnosis not present

## 2016-11-15 DIAGNOSIS — F330 Major depressive disorder, recurrent, mild: Secondary | ICD-10-CM | POA: Diagnosis not present

## 2016-11-30 DIAGNOSIS — D485 Neoplasm of uncertain behavior of skin: Secondary | ICD-10-CM | POA: Diagnosis not present

## 2016-12-05 ENCOUNTER — Ambulatory Visit (INDEPENDENT_AMBULATORY_CARE_PROVIDER_SITE_OTHER): Payer: Medicare Other | Admitting: Nurse Practitioner

## 2016-12-05 ENCOUNTER — Other Ambulatory Visit (HOSPITAL_COMMUNITY)
Admission: RE | Admit: 2016-12-05 | Discharge: 2016-12-05 | Disposition: A | Discharge status: Home / Self Care | Payer: Medicare Other | Source: Ambulatory Visit | Attending: Obstetrics and Gynecology | Admitting: Obstetrics and Gynecology

## 2016-12-05 ENCOUNTER — Encounter: Payer: Self-pay | Admitting: Nurse Practitioner

## 2016-12-05 VITALS — BP 128/62 | HR 78 | Resp 18 | Ht 66.75 in | Wt 163.0 lb

## 2016-12-05 DIAGNOSIS — N952 Postmenopausal atrophic vaginitis: Secondary | ICD-10-CM | POA: Diagnosis not present

## 2016-12-05 DIAGNOSIS — Z01419 Encounter for gynecological examination (general) (routine) without abnormal findings: Secondary | ICD-10-CM

## 2016-12-05 DIAGNOSIS — Z124 Encounter for screening for malignant neoplasm of cervix: Secondary | ICD-10-CM

## 2016-12-05 DIAGNOSIS — Z0000 Encounter for general adult medical examination without abnormal findings: Secondary | ICD-10-CM
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
## 2016-12-05 NOTE — Patient Instructions (Signed)

## 2016-12-06 NOTE — Progress Notes (Signed)
Reviewed personally.  M. Suzanne Dewan Emond, MD.  

## 2016-12-07 LAB — CYTOLOGY - PAP: Diagnosis: NEGATIVE

## 2016-12-14 DIAGNOSIS — F331 Major depressive disorder, recurrent, moderate: Secondary | ICD-10-CM | POA: Diagnosis not present

## 2016-12-27 DIAGNOSIS — F330 Major depressive disorder, recurrent, mild: Secondary | ICD-10-CM | POA: Diagnosis not present

## 2017-01-22 DIAGNOSIS — F330 Major depressive disorder, recurrent, mild: Secondary | ICD-10-CM | POA: Diagnosis not present

## 2017-01-23 DIAGNOSIS — Z1231 Encounter for screening mammogram for malignant neoplasm of breast: Secondary | ICD-10-CM | POA: Diagnosis not present

## 2017-01-23 DIAGNOSIS — Z0000 Encounter for general adult medical examination without abnormal findings: Secondary | ICD-10-CM | POA: Diagnosis not present

## 2017-01-23 DIAGNOSIS — E784 Other hyperlipidemia: Secondary | ICD-10-CM | POA: Diagnosis not present

## 2017-01-23 DIAGNOSIS — M810 Age-related osteoporosis without current pathological fracture: Secondary | ICD-10-CM | POA: Diagnosis not present

## 2017-01-23 DIAGNOSIS — N390 Urinary tract infection, site not specified: Secondary | ICD-10-CM | POA: Diagnosis not present

## 2017-01-30 DIAGNOSIS — I872 Venous insufficiency (chronic) (peripheral): Secondary | ICD-10-CM | POA: Diagnosis not present

## 2017-01-30 DIAGNOSIS — Z0000 Encounter for general adult medical examination without abnormal findings: Secondary | ICD-10-CM | POA: Diagnosis not present

## 2017-01-30 DIAGNOSIS — F325 Major depressive disorder, single episode, in full remission: Secondary | ICD-10-CM | POA: Diagnosis not present

## 2017-01-30 DIAGNOSIS — K219 Gastro-esophageal reflux disease without esophagitis: Secondary | ICD-10-CM | POA: Diagnosis not present

## 2017-01-30 DIAGNOSIS — N6019 Diffuse cystic mastopathy of unspecified breast: Secondary | ICD-10-CM | POA: Diagnosis not present

## 2017-01-30 DIAGNOSIS — E784 Other hyperlipidemia: Secondary | ICD-10-CM | POA: Diagnosis not present

## 2017-01-30 DIAGNOSIS — Z6825 Body mass index (BMI) 25.0-25.9, adult: Secondary | ICD-10-CM | POA: Diagnosis not present

## 2017-01-30 DIAGNOSIS — M810 Age-related osteoporosis without current pathological fracture: Secondary | ICD-10-CM | POA: Diagnosis not present

## 2017-01-30 DIAGNOSIS — M1990 Unspecified osteoarthritis, unspecified site: Secondary | ICD-10-CM | POA: Diagnosis not present

## 2017-01-30 DIAGNOSIS — I700 Atherosclerosis of aorta: Secondary | ICD-10-CM | POA: Diagnosis not present

## 2017-01-30 DIAGNOSIS — Z23 Encounter for immunization: Secondary | ICD-10-CM | POA: Diagnosis not present

## 2017-01-30 DIAGNOSIS — Z1389 Encounter for screening for other disorder: Secondary | ICD-10-CM | POA: Diagnosis not present

## 2017-02-19 DIAGNOSIS — F330 Major depressive disorder, recurrent, mild: Secondary | ICD-10-CM | POA: Diagnosis not present

## 2017-03-13 DIAGNOSIS — L989 Disorder of the skin and subcutaneous tissue, unspecified: Secondary | ICD-10-CM | POA: Diagnosis not present

## 2017-03-14 DIAGNOSIS — F330 Major depressive disorder, recurrent, mild: Secondary | ICD-10-CM | POA: Diagnosis not present

## 2017-05-17 DIAGNOSIS — F330 Major depressive disorder, recurrent, mild: Secondary | ICD-10-CM | POA: Diagnosis not present

## 2017-06-12 DIAGNOSIS — F330 Major depressive disorder, recurrent, mild: Secondary | ICD-10-CM | POA: Diagnosis not present

## 2017-07-19 DIAGNOSIS — F330 Major depressive disorder, recurrent, mild: Secondary | ICD-10-CM | POA: Diagnosis not present

## 2017-07-26 DIAGNOSIS — I700 Atherosclerosis of aorta: Secondary | ICD-10-CM | POA: Diagnosis not present

## 2017-07-26 DIAGNOSIS — E7849 Other hyperlipidemia: Secondary | ICD-10-CM | POA: Diagnosis not present

## 2017-07-26 DIAGNOSIS — M1990 Unspecified osteoarthritis, unspecified site: Secondary | ICD-10-CM | POA: Diagnosis not present

## 2017-07-26 DIAGNOSIS — K5909 Other constipation: Secondary | ICD-10-CM | POA: Diagnosis not present

## 2017-07-26 DIAGNOSIS — F325 Major depressive disorder, single episode, in full remission: Secondary | ICD-10-CM | POA: Diagnosis not present

## 2017-07-26 DIAGNOSIS — Z6825 Body mass index (BMI) 25.0-25.9, adult: Secondary | ICD-10-CM | POA: Diagnosis not present

## 2017-09-05 DIAGNOSIS — F330 Major depressive disorder, recurrent, mild: Secondary | ICD-10-CM | POA: Diagnosis not present

## 2017-09-10 DIAGNOSIS — F330 Major depressive disorder, recurrent, mild: Secondary | ICD-10-CM | POA: Diagnosis not present

## 2017-10-11 DIAGNOSIS — H524 Presbyopia: Secondary | ICD-10-CM | POA: Diagnosis not present

## 2017-10-11 DIAGNOSIS — H2513 Age-related nuclear cataract, bilateral: Secondary | ICD-10-CM | POA: Diagnosis not present

## 2017-10-11 DIAGNOSIS — B029 Zoster without complications: Secondary | ICD-10-CM | POA: Diagnosis not present

## 2017-10-11 DIAGNOSIS — Z6824 Body mass index (BMI) 24.0-24.9, adult: Secondary | ICD-10-CM | POA: Diagnosis not present

## 2017-10-30 DIAGNOSIS — F330 Major depressive disorder, recurrent, mild: Secondary | ICD-10-CM | POA: Diagnosis not present

## 2017-10-30 DIAGNOSIS — F4321 Adjustment disorder with depressed mood: Secondary | ICD-10-CM | POA: Diagnosis not present

## 2017-12-10 DIAGNOSIS — F330 Major depressive disorder, recurrent, mild: Secondary | ICD-10-CM | POA: Diagnosis not present

## 2017-12-19 DIAGNOSIS — F330 Major depressive disorder, recurrent, mild: Secondary | ICD-10-CM | POA: Diagnosis not present

## 2017-12-25 DIAGNOSIS — H16213 Exposure keratoconjunctivitis, bilateral: Secondary | ICD-10-CM | POA: Diagnosis not present

## 2017-12-25 DIAGNOSIS — H04123 Dry eye syndrome of bilateral lacrimal glands: Secondary | ICD-10-CM | POA: Diagnosis not present

## 2018-01-10 DIAGNOSIS — E7849 Other hyperlipidemia: Secondary | ICD-10-CM | POA: Diagnosis not present

## 2018-01-10 DIAGNOSIS — F325 Major depressive disorder, single episode, in full remission: Secondary | ICD-10-CM | POA: Diagnosis not present

## 2018-01-10 DIAGNOSIS — Z6824 Body mass index (BMI) 24.0-24.9, adult: Secondary | ICD-10-CM | POA: Diagnosis not present

## 2018-01-10 DIAGNOSIS — H8110 Benign paroxysmal vertigo, unspecified ear: Secondary | ICD-10-CM | POA: Diagnosis not present

## 2018-01-15 DIAGNOSIS — H25043 Posterior subcapsular polar age-related cataract, bilateral: Secondary | ICD-10-CM | POA: Diagnosis not present

## 2018-01-15 DIAGNOSIS — H18413 Arcus senilis, bilateral: Secondary | ICD-10-CM | POA: Diagnosis not present

## 2018-01-15 DIAGNOSIS — H2513 Age-related nuclear cataract, bilateral: Secondary | ICD-10-CM | POA: Diagnosis not present

## 2018-01-15 DIAGNOSIS — H25013 Cortical age-related cataract, bilateral: Secondary | ICD-10-CM | POA: Diagnosis not present

## 2018-01-15 DIAGNOSIS — H02831 Dermatochalasis of right upper eyelid: Secondary | ICD-10-CM | POA: Diagnosis not present

## 2018-01-23 DIAGNOSIS — Z803 Family history of malignant neoplasm of breast: Secondary | ICD-10-CM | POA: Diagnosis not present

## 2018-01-23 DIAGNOSIS — Z1231 Encounter for screening mammogram for malignant neoplasm of breast: Secondary | ICD-10-CM | POA: Diagnosis not present

## 2018-01-29 DIAGNOSIS — R82998 Other abnormal findings in urine: Secondary | ICD-10-CM | POA: Diagnosis not present

## 2018-01-29 DIAGNOSIS — M810 Age-related osteoporosis without current pathological fracture: Secondary | ICD-10-CM | POA: Diagnosis not present

## 2018-01-29 DIAGNOSIS — E7849 Other hyperlipidemia: Secondary | ICD-10-CM | POA: Diagnosis not present

## 2018-02-04 DIAGNOSIS — Z23 Encounter for immunization: Secondary | ICD-10-CM | POA: Diagnosis not present

## 2018-02-05 DIAGNOSIS — K219 Gastro-esophageal reflux disease without esophagitis: Secondary | ICD-10-CM | POA: Diagnosis not present

## 2018-02-05 DIAGNOSIS — H268 Other specified cataract: Secondary | ICD-10-CM | POA: Diagnosis not present

## 2018-02-05 DIAGNOSIS — E7849 Other hyperlipidemia: Secondary | ICD-10-CM | POA: Diagnosis not present

## 2018-02-05 DIAGNOSIS — Z1389 Encounter for screening for other disorder: Secondary | ICD-10-CM | POA: Diagnosis not present

## 2018-02-05 DIAGNOSIS — K5909 Other constipation: Secondary | ICD-10-CM | POA: Diagnosis not present

## 2018-02-05 DIAGNOSIS — Z0000 Encounter for general adult medical examination without abnormal findings: Secondary | ICD-10-CM | POA: Diagnosis not present

## 2018-02-05 DIAGNOSIS — Z6825 Body mass index (BMI) 25.0-25.9, adult: Secondary | ICD-10-CM | POA: Diagnosis not present

## 2018-02-05 DIAGNOSIS — N6019 Diffuse cystic mastopathy of unspecified breast: Secondary | ICD-10-CM | POA: Diagnosis not present

## 2018-02-05 DIAGNOSIS — I872 Venous insufficiency (chronic) (peripheral): Secondary | ICD-10-CM | POA: Diagnosis not present

## 2018-02-05 DIAGNOSIS — F325 Major depressive disorder, single episode, in full remission: Secondary | ICD-10-CM | POA: Diagnosis not present

## 2018-02-05 DIAGNOSIS — N3941 Urge incontinence: Secondary | ICD-10-CM | POA: Diagnosis not present

## 2018-02-05 DIAGNOSIS — I700 Atherosclerosis of aorta: Secondary | ICD-10-CM | POA: Diagnosis not present

## 2018-02-15 DIAGNOSIS — Z1212 Encounter for screening for malignant neoplasm of rectum: Secondary | ICD-10-CM | POA: Diagnosis not present

## 2018-02-18 DIAGNOSIS — H25813 Combined forms of age-related cataract, bilateral: Secondary | ICD-10-CM | POA: Diagnosis not present

## 2018-02-18 DIAGNOSIS — H35372 Puckering of macula, left eye: Secondary | ICD-10-CM | POA: Diagnosis not present

## 2018-02-18 DIAGNOSIS — Z9889 Other specified postprocedural states: Secondary | ICD-10-CM | POA: Diagnosis not present

## 2018-04-08 DIAGNOSIS — F3341 Major depressive disorder, recurrent, in partial remission: Secondary | ICD-10-CM | POA: Diagnosis not present

## 2018-04-09 DIAGNOSIS — H25813 Combined forms of age-related cataract, bilateral: Secondary | ICD-10-CM | POA: Diagnosis not present

## 2018-04-09 DIAGNOSIS — H25812 Combined forms of age-related cataract, left eye: Secondary | ICD-10-CM | POA: Diagnosis not present

## 2018-04-09 DIAGNOSIS — F418 Other specified anxiety disorders: Secondary | ICD-10-CM | POA: Diagnosis not present

## 2018-04-09 DIAGNOSIS — Z9889 Other specified postprocedural states: Secondary | ICD-10-CM | POA: Diagnosis not present

## 2018-04-10 DIAGNOSIS — Z48810 Encounter for surgical aftercare following surgery on the sense organs: Secondary | ICD-10-CM | POA: Diagnosis not present

## 2018-04-10 DIAGNOSIS — Z961 Presence of intraocular lens: Secondary | ICD-10-CM | POA: Diagnosis not present

## 2018-04-10 DIAGNOSIS — H25811 Combined forms of age-related cataract, right eye: Secondary | ICD-10-CM | POA: Diagnosis not present

## 2018-04-10 DIAGNOSIS — Z9889 Other specified postprocedural states: Secondary | ICD-10-CM | POA: Diagnosis not present

## 2018-04-23 DIAGNOSIS — Z9189 Other specified personal risk factors, not elsewhere classified: Secondary | ICD-10-CM | POA: Diagnosis not present

## 2018-04-23 DIAGNOSIS — T8859XA Other complications of anesthesia, initial encounter: Secondary | ICD-10-CM | POA: Diagnosis not present

## 2018-04-23 DIAGNOSIS — K219 Gastro-esophageal reflux disease without esophagitis: Secondary | ICD-10-CM | POA: Diagnosis not present

## 2018-04-23 DIAGNOSIS — H25811 Combined forms of age-related cataract, right eye: Secondary | ICD-10-CM | POA: Diagnosis not present

## 2018-04-23 DIAGNOSIS — E785 Hyperlipidemia, unspecified: Secondary | ICD-10-CM | POA: Diagnosis not present

## 2018-04-24 DIAGNOSIS — Z961 Presence of intraocular lens: Secondary | ICD-10-CM | POA: Diagnosis not present

## 2018-04-24 DIAGNOSIS — Z9889 Other specified postprocedural states: Secondary | ICD-10-CM | POA: Diagnosis not present

## 2018-04-24 DIAGNOSIS — Z48810 Encounter for surgical aftercare following surgery on the sense organs: Secondary | ICD-10-CM | POA: Diagnosis not present

## 2018-05-08 DIAGNOSIS — H04123 Dry eye syndrome of bilateral lacrimal glands: Secondary | ICD-10-CM | POA: Diagnosis not present

## 2018-05-08 DIAGNOSIS — Z48810 Encounter for surgical aftercare following surgery on the sense organs: Secondary | ICD-10-CM | POA: Diagnosis not present

## 2018-05-08 DIAGNOSIS — H01006 Unspecified blepharitis left eye, unspecified eyelid: Secondary | ICD-10-CM | POA: Diagnosis not present

## 2018-05-08 DIAGNOSIS — Z9889 Other specified postprocedural states: Secondary | ICD-10-CM | POA: Diagnosis not present

## 2018-05-08 DIAGNOSIS — Z961 Presence of intraocular lens: Secondary | ICD-10-CM | POA: Diagnosis not present

## 2018-05-08 DIAGNOSIS — H01003 Unspecified blepharitis right eye, unspecified eyelid: Secondary | ICD-10-CM | POA: Diagnosis not present

## 2018-05-22 ENCOUNTER — Emergency Department (HOSPITAL_COMMUNITY): Payer: Medicare Other

## 2018-05-22 ENCOUNTER — Encounter (HOSPITAL_BASED_OUTPATIENT_CLINIC_OR_DEPARTMENT_OTHER): Payer: Self-pay

## 2018-05-22 ENCOUNTER — Emergency Department (HOSPITAL_BASED_OUTPATIENT_CLINIC_OR_DEPARTMENT_OTHER): Payer: Medicare Other

## 2018-05-22 ENCOUNTER — Emergency Department (HOSPITAL_BASED_OUTPATIENT_CLINIC_OR_DEPARTMENT_OTHER)
Admission: EM | Admit: 2018-05-22 | Discharge: 2018-05-22 | Disposition: A | Discharge status: Home / Self Care | Payer: Medicare Other | Attending: Emergency Medicine | Admitting: Emergency Medicine

## 2018-05-22 DIAGNOSIS — S299XXA Unspecified injury of thorax, initial encounter: Secondary | ICD-10-CM | POA: Diagnosis not present

## 2018-05-22 DIAGNOSIS — S20211A Contusion of right front wall of thorax, initial encounter: Secondary | ICD-10-CM | POA: Diagnosis not present

## 2018-05-22 DIAGNOSIS — W228XXA Striking against or struck by other objects, initial encounter: Secondary | ICD-10-CM | POA: Diagnosis not present

## 2018-05-22 DIAGNOSIS — Y999 Unspecified external cause status: Secondary | ICD-10-CM | POA: Insufficient documentation

## 2018-05-22 DIAGNOSIS — R0789 Other chest pain: Secondary | ICD-10-CM | POA: Diagnosis not present

## 2018-05-22 DIAGNOSIS — Y9389 Activity, other specified: Secondary | ICD-10-CM | POA: Diagnosis not present

## 2018-05-22 DIAGNOSIS — T1490XA Injury, unspecified, initial encounter: Secondary | ICD-10-CM

## 2018-05-22 DIAGNOSIS — Y92003 Bedroom of unspecified non-institutional (private) residence as the place of occurrence of the external cause: Secondary | ICD-10-CM | POA: Insufficient documentation

## 2018-05-22 DIAGNOSIS — Z79899 Other long term (current) drug therapy: Secondary | ICD-10-CM | POA: Insufficient documentation

## 2018-05-22 NOTE — ED Notes (Signed)
NAD at this time. Pt is stable and going home.  

## 2018-05-22 NOTE — Discharge Instructions (Signed)
Take naproxen as needed for the pain.  Should start improving over the next few days.  If you develop shortness of breath you should be rechecked.
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
## 2018-05-22 NOTE — ED Triage Notes (Signed)
Pt states she fell out of her bed 2 days go-struck right chest on corner of nightstand-was advised by PCP to come to ED due to unable to see pt-pt NAD-steady gait

## 2018-07-08 DIAGNOSIS — F330 Major depressive disorder, recurrent, mild: Secondary | ICD-10-CM | POA: Diagnosis not present

## 2018-08-12 DIAGNOSIS — Z6825 Body mass index (BMI) 25.0-25.9, adult: Secondary | ICD-10-CM | POA: Diagnosis not present

## 2018-08-12 DIAGNOSIS — H269 Unspecified cataract: Secondary | ICD-10-CM | POA: Diagnosis not present

## 2018-08-12 DIAGNOSIS — R140 Abdominal distension (gaseous): Secondary | ICD-10-CM | POA: Diagnosis not present

## 2018-08-12 DIAGNOSIS — M1990 Unspecified osteoarthritis, unspecified site: Secondary | ICD-10-CM | POA: Diagnosis not present

## 2018-08-12 DIAGNOSIS — R300 Dysuria: Secondary | ICD-10-CM | POA: Diagnosis not present

## 2018-08-12 DIAGNOSIS — F325 Major depressive disorder, single episode, in full remission: Secondary | ICD-10-CM | POA: Diagnosis not present

## 2018-08-12 DIAGNOSIS — K5900 Constipation, unspecified: Secondary | ICD-10-CM | POA: Diagnosis not present

## 2018-08-12 DIAGNOSIS — N390 Urinary tract infection, site not specified: Secondary | ICD-10-CM | POA: Diagnosis not present

## 2018-08-12 DIAGNOSIS — N3941 Urge incontinence: Secondary | ICD-10-CM | POA: Diagnosis not present

## 2018-08-12 DIAGNOSIS — I700 Atherosclerosis of aorta: Secondary | ICD-10-CM | POA: Diagnosis not present

## 2018-08-12 DIAGNOSIS — K219 Gastro-esophageal reflux disease without esophagitis: Secondary | ICD-10-CM | POA: Diagnosis not present

## 2018-08-16 DIAGNOSIS — L821 Other seborrheic keratosis: Secondary | ICD-10-CM | POA: Diagnosis not present

## 2018-08-19 DIAGNOSIS — H04123 Dry eye syndrome of bilateral lacrimal glands: Secondary | ICD-10-CM | POA: Diagnosis not present

## 2018-08-19 DIAGNOSIS — H17813 Minor opacity of cornea, bilateral: Secondary | ICD-10-CM | POA: Diagnosis not present

## 2018-08-19 DIAGNOSIS — H01006 Unspecified blepharitis left eye, unspecified eyelid: Secondary | ICD-10-CM | POA: Diagnosis not present

## 2018-08-19 DIAGNOSIS — H26493 Other secondary cataract, bilateral: Secondary | ICD-10-CM | POA: Diagnosis not present

## 2018-08-19 DIAGNOSIS — H01003 Unspecified blepharitis right eye, unspecified eyelid: Secondary | ICD-10-CM | POA: Diagnosis not present

## 2018-08-19 DIAGNOSIS — Z9842 Cataract extraction status, left eye: Secondary | ICD-10-CM | POA: Diagnosis not present

## 2018-08-19 DIAGNOSIS — Z9889 Other specified postprocedural states: Secondary | ICD-10-CM | POA: Diagnosis not present

## 2018-08-19 DIAGNOSIS — Z9841 Cataract extraction status, right eye: Secondary | ICD-10-CM | POA: Diagnosis not present

## 2018-08-19 DIAGNOSIS — Z961 Presence of intraocular lens: Secondary | ICD-10-CM | POA: Diagnosis not present

## 2018-10-02 DIAGNOSIS — H43813 Vitreous degeneration, bilateral: Secondary | ICD-10-CM | POA: Diagnosis not present

## 2018-10-02 DIAGNOSIS — H04123 Dry eye syndrome of bilateral lacrimal glands: Secondary | ICD-10-CM | POA: Diagnosis not present

## 2018-10-02 DIAGNOSIS — Z961 Presence of intraocular lens: Secondary | ICD-10-CM | POA: Diagnosis not present

## 2018-10-02 DIAGNOSIS — Z9841 Cataract extraction status, right eye: Secondary | ICD-10-CM | POA: Diagnosis not present

## 2018-10-02 DIAGNOSIS — Z9842 Cataract extraction status, left eye: Secondary | ICD-10-CM | POA: Diagnosis not present

## 2018-10-02 DIAGNOSIS — H26492 Other secondary cataract, left eye: Secondary | ICD-10-CM | POA: Diagnosis not present

## 2018-10-07 DIAGNOSIS — F330 Major depressive disorder, recurrent, mild: Secondary | ICD-10-CM | POA: Diagnosis not present

## 2018-11-06 DIAGNOSIS — H01006 Unspecified blepharitis left eye, unspecified eyelid: Secondary | ICD-10-CM | POA: Diagnosis not present

## 2018-11-06 DIAGNOSIS — H04123 Dry eye syndrome of bilateral lacrimal glands: Secondary | ICD-10-CM | POA: Diagnosis not present

## 2018-11-06 DIAGNOSIS — H02403 Unspecified ptosis of bilateral eyelids: Secondary | ICD-10-CM | POA: Diagnosis not present

## 2018-11-06 DIAGNOSIS — H01003 Unspecified blepharitis right eye, unspecified eyelid: Secondary | ICD-10-CM | POA: Diagnosis not present

## 2018-11-06 DIAGNOSIS — Z961 Presence of intraocular lens: Secondary | ICD-10-CM | POA: Diagnosis not present

## 2018-11-06 DIAGNOSIS — Z9841 Cataract extraction status, right eye: Secondary | ICD-10-CM | POA: Diagnosis not present

## 2018-11-06 DIAGNOSIS — Z9889 Other specified postprocedural states: Secondary | ICD-10-CM | POA: Diagnosis not present

## 2018-11-06 DIAGNOSIS — Z9842 Cataract extraction status, left eye: Secondary | ICD-10-CM | POA: Diagnosis not present

## 2018-12-03 DIAGNOSIS — H02403 Unspecified ptosis of bilateral eyelids: Secondary | ICD-10-CM | POA: Diagnosis not present

## 2018-12-04 DIAGNOSIS — H02403 Unspecified ptosis of bilateral eyelids: Secondary | ICD-10-CM | POA: Diagnosis not present

## 2018-12-04 DIAGNOSIS — Z48810 Encounter for surgical aftercare following surgery on the sense organs: Secondary | ICD-10-CM | POA: Diagnosis not present

## 2018-12-04 DIAGNOSIS — H01003 Unspecified blepharitis right eye, unspecified eyelid: Secondary | ICD-10-CM | POA: Diagnosis not present

## 2018-12-04 DIAGNOSIS — Z961 Presence of intraocular lens: Secondary | ICD-10-CM | POA: Diagnosis not present

## 2018-12-04 DIAGNOSIS — Z9889 Other specified postprocedural states: Secondary | ICD-10-CM | POA: Diagnosis not present

## 2018-12-04 DIAGNOSIS — H01006 Unspecified blepharitis left eye, unspecified eyelid: Secondary | ICD-10-CM | POA: Diagnosis not present

## 2019-01-27 DIAGNOSIS — Z803 Family history of malignant neoplasm of breast: Secondary | ICD-10-CM | POA: Diagnosis not present

## 2019-01-27 DIAGNOSIS — Z1231 Encounter for screening mammogram for malignant neoplasm of breast: Secondary | ICD-10-CM | POA: Diagnosis not present

## 2019-01-29 DIAGNOSIS — Z961 Presence of intraocular lens: Secondary | ICD-10-CM | POA: Diagnosis not present

## 2019-01-29 DIAGNOSIS — H02423 Myogenic ptosis of bilateral eyelids: Secondary | ICD-10-CM | POA: Diagnosis not present

## 2019-01-30 DIAGNOSIS — F3341 Major depressive disorder, recurrent, in partial remission: Secondary | ICD-10-CM | POA: Diagnosis not present

## 2019-02-20 DIAGNOSIS — M810 Age-related osteoporosis without current pathological fracture: Secondary | ICD-10-CM | POA: Diagnosis not present

## 2019-02-20 DIAGNOSIS — E7849 Other hyperlipidemia: Secondary | ICD-10-CM | POA: Diagnosis not present

## 2019-02-20 DIAGNOSIS — Z23 Encounter for immunization: Secondary | ICD-10-CM | POA: Diagnosis not present

## 2019-02-24 DIAGNOSIS — K219 Gastro-esophageal reflux disease without esophagitis: Secondary | ICD-10-CM | POA: Diagnosis not present

## 2019-02-24 DIAGNOSIS — H9319 Tinnitus, unspecified ear: Secondary | ICD-10-CM | POA: Diagnosis not present

## 2019-02-24 DIAGNOSIS — N3941 Urge incontinence: Secondary | ICD-10-CM | POA: Diagnosis not present

## 2019-02-24 DIAGNOSIS — N6019 Diffuse cystic mastopathy of unspecified breast: Secondary | ICD-10-CM | POA: Diagnosis not present

## 2019-02-24 DIAGNOSIS — M1990 Unspecified osteoarthritis, unspecified site: Secondary | ICD-10-CM | POA: Diagnosis not present

## 2019-02-24 DIAGNOSIS — I700 Atherosclerosis of aorta: Secondary | ICD-10-CM | POA: Diagnosis not present

## 2019-02-24 DIAGNOSIS — E785 Hyperlipidemia, unspecified: Secondary | ICD-10-CM | POA: Diagnosis not present

## 2019-02-24 DIAGNOSIS — H269 Unspecified cataract: Secondary | ICD-10-CM | POA: Diagnosis not present

## 2019-02-24 DIAGNOSIS — M810 Age-related osteoporosis without current pathological fracture: Secondary | ICD-10-CM | POA: Diagnosis not present

## 2019-02-24 DIAGNOSIS — H02409 Unspecified ptosis of unspecified eyelid: Secondary | ICD-10-CM | POA: Diagnosis not present

## 2019-02-24 DIAGNOSIS — F325 Major depressive disorder, single episode, in full remission: Secondary | ICD-10-CM | POA: Diagnosis not present

## 2019-02-24 DIAGNOSIS — Z0000 Encounter for general adult medical examination without abnormal findings: Secondary | ICD-10-CM | POA: Diagnosis not present

## 2019-02-25 DIAGNOSIS — R82998 Other abnormal findings in urine: Secondary | ICD-10-CM | POA: Diagnosis not present

## 2019-02-27 DIAGNOSIS — Z1212 Encounter for screening for malignant neoplasm of rectum: Secondary | ICD-10-CM | POA: Diagnosis not present

## 2019-06-16 DIAGNOSIS — Z23 Encounter for immunization: Secondary | ICD-10-CM | POA: Diagnosis not present

## 2019-07-15 DIAGNOSIS — Z23 Encounter for immunization: Secondary | ICD-10-CM | POA: Diagnosis not present

## 2019-09-01 DIAGNOSIS — H018 Other specified inflammations of eyelid: Secondary | ICD-10-CM | POA: Diagnosis not present

## 2019-09-01 DIAGNOSIS — L723 Sebaceous cyst: Secondary | ICD-10-CM | POA: Diagnosis not present

## 2019-10-02 DIAGNOSIS — H26493 Other secondary cataract, bilateral: Secondary | ICD-10-CM | POA: Diagnosis not present

## 2019-10-02 DIAGNOSIS — H35033 Hypertensive retinopathy, bilateral: Secondary | ICD-10-CM | POA: Diagnosis not present

## 2019-10-02 DIAGNOSIS — H524 Presbyopia: Secondary | ICD-10-CM | POA: Diagnosis not present

## 2019-10-15 DIAGNOSIS — H02824 Cysts of left upper eyelid: Secondary | ICD-10-CM | POA: Diagnosis not present

## 2019-10-15 DIAGNOSIS — L928 Other granulomatous disorders of the skin and subcutaneous tissue: Secondary | ICD-10-CM | POA: Diagnosis not present

## 2019-10-15 DIAGNOSIS — H02821 Cysts of right upper eyelid: Secondary | ICD-10-CM | POA: Diagnosis not present

## 2019-10-15 DIAGNOSIS — H018 Other specified inflammations of eyelid: Secondary | ICD-10-CM | POA: Diagnosis not present

## 2019-10-23 DIAGNOSIS — W57XXXA Bitten or stung by nonvenomous insect and other nonvenomous arthropods, initial encounter: Secondary | ICD-10-CM | POA: Diagnosis not present

## 2019-10-23 DIAGNOSIS — S0006XA Insect bite (nonvenomous) of scalp, initial encounter: Secondary | ICD-10-CM | POA: Diagnosis not present

## 2020-02-02 DIAGNOSIS — Z1231 Encounter for screening mammogram for malignant neoplasm of breast: Secondary | ICD-10-CM | POA: Diagnosis not present

## 2020-02-23 DIAGNOSIS — E785 Hyperlipidemia, unspecified: Secondary | ICD-10-CM | POA: Diagnosis not present

## 2020-03-01 DIAGNOSIS — I872 Venous insufficiency (chronic) (peripheral): Secondary | ICD-10-CM | POA: Diagnosis not present

## 2020-03-01 DIAGNOSIS — E785 Hyperlipidemia, unspecified: Secondary | ICD-10-CM | POA: Diagnosis not present

## 2020-03-01 DIAGNOSIS — M1990 Unspecified osteoarthritis, unspecified site: Secondary | ICD-10-CM | POA: Diagnosis not present

## 2020-03-01 DIAGNOSIS — F418 Other specified anxiety disorders: Secondary | ICD-10-CM | POA: Diagnosis not present

## 2020-03-01 DIAGNOSIS — Z0000 Encounter for general adult medical examination without abnormal findings: Secondary | ICD-10-CM | POA: Diagnosis not present

## 2020-03-01 DIAGNOSIS — F325 Major depressive disorder, single episode, in full remission: Secondary | ICD-10-CM | POA: Diagnosis not present

## 2020-03-01 DIAGNOSIS — M810 Age-related osteoporosis without current pathological fracture: Secondary | ICD-10-CM | POA: Diagnosis not present

## 2020-03-01 DIAGNOSIS — Z23 Encounter for immunization: Secondary | ICD-10-CM | POA: Diagnosis not present

## 2020-03-01 DIAGNOSIS — N6019 Diffuse cystic mastopathy of unspecified breast: Secondary | ICD-10-CM | POA: Diagnosis not present

## 2020-03-01 DIAGNOSIS — I700 Atherosclerosis of aorta: Secondary | ICD-10-CM | POA: Diagnosis not present

## 2020-03-01 DIAGNOSIS — R194 Change in bowel habit: Secondary | ICD-10-CM | POA: Diagnosis not present

## 2020-03-02 DIAGNOSIS — Z1212 Encounter for screening for malignant neoplasm of rectum: Secondary | ICD-10-CM | POA: Diagnosis not present

## 2020-06-02 DIAGNOSIS — R293 Abnormal posture: Secondary | ICD-10-CM | POA: Diagnosis not present

## 2020-06-02 DIAGNOSIS — M5459 Other low back pain: Secondary | ICD-10-CM | POA: Diagnosis not present

## 2020-06-02 DIAGNOSIS — M542 Cervicalgia: Secondary | ICD-10-CM | POA: Diagnosis not present

## 2020-06-03 DIAGNOSIS — F330 Major depressive disorder, recurrent, mild: Secondary | ICD-10-CM | POA: Diagnosis not present

## 2020-06-04 DIAGNOSIS — M542 Cervicalgia: Secondary | ICD-10-CM | POA: Diagnosis not present

## 2020-06-04 DIAGNOSIS — R293 Abnormal posture: Secondary | ICD-10-CM | POA: Diagnosis not present

## 2020-06-04 DIAGNOSIS — M5459 Other low back pain: Secondary | ICD-10-CM | POA: Diagnosis not present

## 2020-06-08 DIAGNOSIS — M542 Cervicalgia: Secondary | ICD-10-CM | POA: Diagnosis not present

## 2020-06-08 DIAGNOSIS — M5459 Other low back pain: Secondary | ICD-10-CM | POA: Diagnosis not present

## 2020-06-08 DIAGNOSIS — R293 Abnormal posture: Secondary | ICD-10-CM | POA: Diagnosis not present

## 2020-06-11 DIAGNOSIS — R293 Abnormal posture: Secondary | ICD-10-CM | POA: Diagnosis not present

## 2020-06-11 DIAGNOSIS — M542 Cervicalgia: Secondary | ICD-10-CM | POA: Diagnosis not present

## 2020-06-11 DIAGNOSIS — M5459 Other low back pain: Secondary | ICD-10-CM | POA: Diagnosis not present

## 2020-06-14 DIAGNOSIS — M5459 Other low back pain: Secondary | ICD-10-CM | POA: Diagnosis not present

## 2020-06-14 DIAGNOSIS — R293 Abnormal posture: Secondary | ICD-10-CM | POA: Diagnosis not present

## 2020-06-14 DIAGNOSIS — M542 Cervicalgia: Secondary | ICD-10-CM | POA: Diagnosis not present

## 2020-06-18 DIAGNOSIS — M5459 Other low back pain: Secondary | ICD-10-CM | POA: Diagnosis not present

## 2020-06-18 DIAGNOSIS — M542 Cervicalgia: Secondary | ICD-10-CM | POA: Diagnosis not present

## 2020-06-18 DIAGNOSIS — R293 Abnormal posture: Secondary | ICD-10-CM | POA: Diagnosis not present

## 2020-06-22 DIAGNOSIS — R293 Abnormal posture: Secondary | ICD-10-CM | POA: Diagnosis not present

## 2020-06-22 DIAGNOSIS — M5459 Other low back pain: Secondary | ICD-10-CM | POA: Diagnosis not present

## 2020-06-22 DIAGNOSIS — M542 Cervicalgia: Secondary | ICD-10-CM | POA: Diagnosis not present

## 2020-06-24 DIAGNOSIS — R293 Abnormal posture: Secondary | ICD-10-CM | POA: Diagnosis not present

## 2020-06-24 DIAGNOSIS — M542 Cervicalgia: Secondary | ICD-10-CM | POA: Diagnosis not present

## 2020-06-24 DIAGNOSIS — M5459 Other low back pain: Secondary | ICD-10-CM | POA: Diagnosis not present

## 2020-06-25 DIAGNOSIS — M5459 Other low back pain: Secondary | ICD-10-CM | POA: Diagnosis not present

## 2020-06-25 DIAGNOSIS — R293 Abnormal posture: Secondary | ICD-10-CM | POA: Diagnosis not present

## 2020-06-25 DIAGNOSIS — M542 Cervicalgia: Secondary | ICD-10-CM | POA: Diagnosis not present

## 2020-06-28 DIAGNOSIS — R293 Abnormal posture: Secondary | ICD-10-CM | POA: Diagnosis not present

## 2020-06-28 DIAGNOSIS — M542 Cervicalgia: Secondary | ICD-10-CM | POA: Diagnosis not present

## 2020-06-28 DIAGNOSIS — M5459 Other low back pain: Secondary | ICD-10-CM | POA: Diagnosis not present

## 2020-06-30 DIAGNOSIS — M5459 Other low back pain: Secondary | ICD-10-CM | POA: Diagnosis not present

## 2020-06-30 DIAGNOSIS — R293 Abnormal posture: Secondary | ICD-10-CM | POA: Diagnosis not present

## 2020-06-30 DIAGNOSIS — M542 Cervicalgia: Secondary | ICD-10-CM | POA: Diagnosis not present

## 2020-07-02 DIAGNOSIS — M542 Cervicalgia: Secondary | ICD-10-CM | POA: Diagnosis not present

## 2020-07-02 DIAGNOSIS — R293 Abnormal posture: Secondary | ICD-10-CM | POA: Diagnosis not present

## 2020-07-02 DIAGNOSIS — M5459 Other low back pain: Secondary | ICD-10-CM | POA: Diagnosis not present

## 2020-07-05 DIAGNOSIS — R293 Abnormal posture: Secondary | ICD-10-CM | POA: Diagnosis not present

## 2020-07-05 DIAGNOSIS — M5459 Other low back pain: Secondary | ICD-10-CM | POA: Diagnosis not present

## 2020-07-05 DIAGNOSIS — M542 Cervicalgia: Secondary | ICD-10-CM | POA: Diagnosis not present

## 2020-07-07 DIAGNOSIS — R293 Abnormal posture: Secondary | ICD-10-CM | POA: Diagnosis not present

## 2020-07-07 DIAGNOSIS — M542 Cervicalgia: Secondary | ICD-10-CM | POA: Diagnosis not present

## 2020-07-07 DIAGNOSIS — M5459 Other low back pain: Secondary | ICD-10-CM | POA: Diagnosis not present

## 2020-07-09 DIAGNOSIS — R293 Abnormal posture: Secondary | ICD-10-CM | POA: Diagnosis not present

## 2020-07-09 DIAGNOSIS — M542 Cervicalgia: Secondary | ICD-10-CM | POA: Diagnosis not present

## 2020-07-09 DIAGNOSIS — M5459 Other low back pain: Secondary | ICD-10-CM | POA: Diagnosis not present

## 2020-07-12 DIAGNOSIS — M5459 Other low back pain: Secondary | ICD-10-CM | POA: Diagnosis not present

## 2020-07-12 DIAGNOSIS — M542 Cervicalgia: Secondary | ICD-10-CM | POA: Diagnosis not present

## 2020-07-12 DIAGNOSIS — R293 Abnormal posture: Secondary | ICD-10-CM | POA: Diagnosis not present

## 2020-07-14 DIAGNOSIS — M5459 Other low back pain: Secondary | ICD-10-CM | POA: Diagnosis not present

## 2020-07-14 DIAGNOSIS — R293 Abnormal posture: Secondary | ICD-10-CM | POA: Diagnosis not present

## 2020-07-14 DIAGNOSIS — M542 Cervicalgia: Secondary | ICD-10-CM | POA: Diagnosis not present

## 2020-07-16 DIAGNOSIS — M5459 Other low back pain: Secondary | ICD-10-CM | POA: Diagnosis not present

## 2020-07-16 DIAGNOSIS — R293 Abnormal posture: Secondary | ICD-10-CM | POA: Diagnosis not present

## 2020-07-16 DIAGNOSIS — M542 Cervicalgia: Secondary | ICD-10-CM | POA: Diagnosis not present

## 2020-07-19 DIAGNOSIS — M542 Cervicalgia: Secondary | ICD-10-CM | POA: Diagnosis not present

## 2020-07-19 DIAGNOSIS — R293 Abnormal posture: Secondary | ICD-10-CM | POA: Diagnosis not present

## 2020-07-19 DIAGNOSIS — M5459 Other low back pain: Secondary | ICD-10-CM | POA: Diagnosis not present

## 2020-07-21 DIAGNOSIS — R293 Abnormal posture: Secondary | ICD-10-CM | POA: Diagnosis not present

## 2020-07-21 DIAGNOSIS — M5459 Other low back pain: Secondary | ICD-10-CM | POA: Diagnosis not present

## 2020-07-21 DIAGNOSIS — M542 Cervicalgia: Secondary | ICD-10-CM | POA: Diagnosis not present

## 2020-07-23 DIAGNOSIS — M542 Cervicalgia: Secondary | ICD-10-CM | POA: Diagnosis not present

## 2020-07-23 DIAGNOSIS — M5459 Other low back pain: Secondary | ICD-10-CM | POA: Diagnosis not present

## 2020-07-23 DIAGNOSIS — R293 Abnormal posture: Secondary | ICD-10-CM | POA: Diagnosis not present

## 2020-07-27 DIAGNOSIS — R293 Abnormal posture: Secondary | ICD-10-CM | POA: Diagnosis not present

## 2020-07-27 DIAGNOSIS — M5459 Other low back pain: Secondary | ICD-10-CM | POA: Diagnosis not present

## 2020-07-27 DIAGNOSIS — M542 Cervicalgia: Secondary | ICD-10-CM | POA: Diagnosis not present

## 2020-07-28 DIAGNOSIS — M542 Cervicalgia: Secondary | ICD-10-CM | POA: Diagnosis not present

## 2020-07-28 DIAGNOSIS — M5459 Other low back pain: Secondary | ICD-10-CM | POA: Diagnosis not present

## 2020-07-28 DIAGNOSIS — R293 Abnormal posture: Secondary | ICD-10-CM | POA: Diagnosis not present

## 2020-07-30 DIAGNOSIS — M542 Cervicalgia: Secondary | ICD-10-CM | POA: Diagnosis not present

## 2020-07-30 DIAGNOSIS — M5459 Other low back pain: Secondary | ICD-10-CM | POA: Diagnosis not present

## 2020-07-30 DIAGNOSIS — R293 Abnormal posture: Secondary | ICD-10-CM | POA: Diagnosis not present

## 2020-08-02 DIAGNOSIS — M5459 Other low back pain: Secondary | ICD-10-CM | POA: Diagnosis not present

## 2020-08-02 DIAGNOSIS — R293 Abnormal posture: Secondary | ICD-10-CM | POA: Diagnosis not present

## 2020-08-02 DIAGNOSIS — M542 Cervicalgia: Secondary | ICD-10-CM | POA: Diagnosis not present

## 2020-08-04 DIAGNOSIS — M542 Cervicalgia: Secondary | ICD-10-CM | POA: Diagnosis not present

## 2020-08-04 DIAGNOSIS — M5459 Other low back pain: Secondary | ICD-10-CM | POA: Diagnosis not present

## 2020-08-04 DIAGNOSIS — R293 Abnormal posture: Secondary | ICD-10-CM | POA: Diagnosis not present

## 2020-08-06 DIAGNOSIS — R293 Abnormal posture: Secondary | ICD-10-CM | POA: Diagnosis not present

## 2020-08-06 DIAGNOSIS — M542 Cervicalgia: Secondary | ICD-10-CM | POA: Diagnosis not present

## 2020-08-06 DIAGNOSIS — M5459 Other low back pain: Secondary | ICD-10-CM | POA: Diagnosis not present

## 2020-08-09 DIAGNOSIS — M5459 Other low back pain: Secondary | ICD-10-CM | POA: Diagnosis not present

## 2020-08-09 DIAGNOSIS — R293 Abnormal posture: Secondary | ICD-10-CM | POA: Diagnosis not present

## 2020-08-09 DIAGNOSIS — M542 Cervicalgia: Secondary | ICD-10-CM | POA: Diagnosis not present

## 2020-08-11 DIAGNOSIS — M5459 Other low back pain: Secondary | ICD-10-CM | POA: Diagnosis not present

## 2020-08-11 DIAGNOSIS — M542 Cervicalgia: Secondary | ICD-10-CM | POA: Diagnosis not present

## 2020-08-11 DIAGNOSIS — R293 Abnormal posture: Secondary | ICD-10-CM | POA: Diagnosis not present

## 2020-08-12 DIAGNOSIS — M542 Cervicalgia: Secondary | ICD-10-CM | POA: Diagnosis not present

## 2020-08-12 DIAGNOSIS — M25511 Pain in right shoulder: Secondary | ICD-10-CM | POA: Diagnosis not present

## 2020-08-16 DIAGNOSIS — M542 Cervicalgia: Secondary | ICD-10-CM | POA: Diagnosis not present

## 2020-08-16 DIAGNOSIS — M5459 Other low back pain: Secondary | ICD-10-CM | POA: Diagnosis not present

## 2020-08-16 DIAGNOSIS — R293 Abnormal posture: Secondary | ICD-10-CM | POA: Diagnosis not present

## 2020-08-18 DIAGNOSIS — R293 Abnormal posture: Secondary | ICD-10-CM | POA: Diagnosis not present

## 2020-08-18 DIAGNOSIS — M5459 Other low back pain: Secondary | ICD-10-CM | POA: Diagnosis not present

## 2020-08-18 DIAGNOSIS — M542 Cervicalgia: Secondary | ICD-10-CM | POA: Diagnosis not present

## 2020-08-23 DIAGNOSIS — R293 Abnormal posture: Secondary | ICD-10-CM | POA: Diagnosis not present

## 2020-08-23 DIAGNOSIS — M5459 Other low back pain: Secondary | ICD-10-CM | POA: Diagnosis not present

## 2020-08-23 DIAGNOSIS — M542 Cervicalgia: Secondary | ICD-10-CM | POA: Diagnosis not present

## 2020-08-25 DIAGNOSIS — M542 Cervicalgia: Secondary | ICD-10-CM | POA: Diagnosis not present

## 2020-08-25 DIAGNOSIS — R293 Abnormal posture: Secondary | ICD-10-CM | POA: Diagnosis not present

## 2020-08-25 DIAGNOSIS — M5459 Other low back pain: Secondary | ICD-10-CM | POA: Diagnosis not present

## 2020-08-30 DIAGNOSIS — R293 Abnormal posture: Secondary | ICD-10-CM | POA: Diagnosis not present

## 2020-08-30 DIAGNOSIS — M5459 Other low back pain: Secondary | ICD-10-CM | POA: Diagnosis not present

## 2020-08-30 DIAGNOSIS — M542 Cervicalgia: Secondary | ICD-10-CM | POA: Diagnosis not present

## 2020-09-01 DIAGNOSIS — R293 Abnormal posture: Secondary | ICD-10-CM | POA: Diagnosis not present

## 2020-09-01 DIAGNOSIS — M5459 Other low back pain: Secondary | ICD-10-CM | POA: Diagnosis not present

## 2020-09-01 DIAGNOSIS — M542 Cervicalgia: Secondary | ICD-10-CM | POA: Diagnosis not present

## 2020-09-06 DIAGNOSIS — M542 Cervicalgia: Secondary | ICD-10-CM | POA: Diagnosis not present

## 2020-09-06 DIAGNOSIS — R293 Abnormal posture: Secondary | ICD-10-CM | POA: Diagnosis not present

## 2020-09-06 DIAGNOSIS — M5459 Other low back pain: Secondary | ICD-10-CM | POA: Diagnosis not present

## 2020-09-08 DIAGNOSIS — R293 Abnormal posture: Secondary | ICD-10-CM | POA: Diagnosis not present

## 2020-09-08 DIAGNOSIS — M5459 Other low back pain: Secondary | ICD-10-CM | POA: Diagnosis not present

## 2020-09-08 DIAGNOSIS — M542 Cervicalgia: Secondary | ICD-10-CM | POA: Diagnosis not present

## 2020-09-13 DIAGNOSIS — M5459 Other low back pain: Secondary | ICD-10-CM | POA: Diagnosis not present

## 2020-09-13 DIAGNOSIS — M542 Cervicalgia: Secondary | ICD-10-CM | POA: Diagnosis not present

## 2020-09-13 DIAGNOSIS — R293 Abnormal posture: Secondary | ICD-10-CM | POA: Diagnosis not present

## 2020-09-15 DIAGNOSIS — R293 Abnormal posture: Secondary | ICD-10-CM | POA: Diagnosis not present

## 2020-09-15 DIAGNOSIS — M542 Cervicalgia: Secondary | ICD-10-CM | POA: Diagnosis not present

## 2020-09-15 DIAGNOSIS — M5459 Other low back pain: Secondary | ICD-10-CM | POA: Diagnosis not present

## 2020-09-16 DIAGNOSIS — M519 Unspecified thoracic, thoracolumbar and lumbosacral intervertebral disc disorder: Secondary | ICD-10-CM | POA: Diagnosis not present

## 2020-09-16 DIAGNOSIS — M542 Cervicalgia: Secondary | ICD-10-CM | POA: Diagnosis not present

## 2020-09-20 DIAGNOSIS — R293 Abnormal posture: Secondary | ICD-10-CM | POA: Diagnosis not present

## 2020-09-20 DIAGNOSIS — M542 Cervicalgia: Secondary | ICD-10-CM | POA: Diagnosis not present

## 2020-09-20 DIAGNOSIS — M5459 Other low back pain: Secondary | ICD-10-CM | POA: Diagnosis not present

## 2020-09-22 DIAGNOSIS — M542 Cervicalgia: Secondary | ICD-10-CM | POA: Diagnosis not present

## 2020-09-22 DIAGNOSIS — R293 Abnormal posture: Secondary | ICD-10-CM | POA: Diagnosis not present

## 2020-09-22 DIAGNOSIS — M5459 Other low back pain: Secondary | ICD-10-CM | POA: Diagnosis not present

## 2020-09-27 DIAGNOSIS — M542 Cervicalgia: Secondary | ICD-10-CM | POA: Diagnosis not present

## 2020-09-27 DIAGNOSIS — R293 Abnormal posture: Secondary | ICD-10-CM | POA: Diagnosis not present

## 2020-09-27 DIAGNOSIS — M5459 Other low back pain: Secondary | ICD-10-CM | POA: Diagnosis not present

## 2020-09-29 DIAGNOSIS — M5459 Other low back pain: Secondary | ICD-10-CM | POA: Diagnosis not present

## 2020-09-29 DIAGNOSIS — R293 Abnormal posture: Secondary | ICD-10-CM | POA: Diagnosis not present

## 2020-09-29 DIAGNOSIS — M542 Cervicalgia: Secondary | ICD-10-CM | POA: Diagnosis not present

## 2020-10-05 DIAGNOSIS — M542 Cervicalgia: Secondary | ICD-10-CM | POA: Diagnosis not present

## 2020-10-05 DIAGNOSIS — R293 Abnormal posture: Secondary | ICD-10-CM | POA: Diagnosis not present

## 2020-10-05 DIAGNOSIS — M5459 Other low back pain: Secondary | ICD-10-CM | POA: Diagnosis not present

## 2020-10-08 DIAGNOSIS — M542 Cervicalgia: Secondary | ICD-10-CM | POA: Diagnosis not present

## 2020-10-08 DIAGNOSIS — M5459 Other low back pain: Secondary | ICD-10-CM | POA: Diagnosis not present

## 2020-10-08 DIAGNOSIS — R293 Abnormal posture: Secondary | ICD-10-CM | POA: Diagnosis not present

## 2020-11-30 DIAGNOSIS — F331 Major depressive disorder, recurrent, moderate: Secondary | ICD-10-CM | POA: Diagnosis not present

## 2020-12-23 DIAGNOSIS — S50862A Insect bite (nonvenomous) of left forearm, initial encounter: Secondary | ICD-10-CM | POA: Diagnosis not present

## 2020-12-23 DIAGNOSIS — W57XXXA Bitten or stung by nonvenomous insect and other nonvenomous arthropods, initial encounter: Secondary | ICD-10-CM | POA: Diagnosis not present

## 2021-02-08 DIAGNOSIS — Z1231 Encounter for screening mammogram for malignant neoplasm of breast: Secondary | ICD-10-CM | POA: Diagnosis not present

## 2021-02-25 DIAGNOSIS — E785 Hyperlipidemia, unspecified: Secondary | ICD-10-CM | POA: Diagnosis not present

## 2021-02-25 DIAGNOSIS — M810 Age-related osteoporosis without current pathological fracture: Secondary | ICD-10-CM | POA: Diagnosis not present

## 2021-03-03 DIAGNOSIS — H524 Presbyopia: Secondary | ICD-10-CM | POA: Diagnosis not present

## 2021-03-03 DIAGNOSIS — H26493 Other secondary cataract, bilateral: Secondary | ICD-10-CM | POA: Diagnosis not present

## 2021-03-03 DIAGNOSIS — H52222 Regular astigmatism, left eye: Secondary | ICD-10-CM | POA: Diagnosis not present

## 2021-03-03 DIAGNOSIS — H35033 Hypertensive retinopathy, bilateral: Secondary | ICD-10-CM | POA: Diagnosis not present

## 2021-03-04 DIAGNOSIS — Z1389 Encounter for screening for other disorder: Secondary | ICD-10-CM | POA: Diagnosis not present

## 2021-03-04 DIAGNOSIS — R82998 Other abnormal findings in urine: Secondary | ICD-10-CM | POA: Diagnosis not present

## 2021-03-04 DIAGNOSIS — Z1212 Encounter for screening for malignant neoplasm of rectum: Secondary | ICD-10-CM | POA: Diagnosis not present

## 2021-03-04 DIAGNOSIS — Z0000 Encounter for general adult medical examination without abnormal findings: Secondary | ICD-10-CM | POA: Diagnosis not present

## 2021-03-04 DIAGNOSIS — Z1331 Encounter for screening for depression: Secondary | ICD-10-CM | POA: Diagnosis not present

## 2021-03-04 DIAGNOSIS — I700 Atherosclerosis of aorta: Secondary | ICD-10-CM | POA: Diagnosis not present

## 2021-03-04 DIAGNOSIS — F418 Other specified anxiety disorders: Secondary | ICD-10-CM | POA: Diagnosis not present

## 2021-03-04 DIAGNOSIS — K219 Gastro-esophageal reflux disease without esophagitis: Secondary | ICD-10-CM | POA: Diagnosis not present

## 2021-03-04 DIAGNOSIS — K5900 Constipation, unspecified: Secondary | ICD-10-CM | POA: Diagnosis not present

## 2021-03-04 DIAGNOSIS — E785 Hyperlipidemia, unspecified: Secondary | ICD-10-CM | POA: Diagnosis not present

## 2021-03-04 DIAGNOSIS — M1990 Unspecified osteoarthritis, unspecified site: Secondary | ICD-10-CM | POA: Diagnosis not present

## 2021-03-04 DIAGNOSIS — M810 Age-related osteoporosis without current pathological fracture: Secondary | ICD-10-CM | POA: Diagnosis not present

## 2021-03-04 DIAGNOSIS — I872 Venous insufficiency (chronic) (peripheral): Secondary | ICD-10-CM | POA: Diagnosis not present

## 2021-03-04 DIAGNOSIS — F325 Major depressive disorder, single episode, in full remission: Secondary | ICD-10-CM | POA: Diagnosis not present

## 2021-03-08 DIAGNOSIS — Z23 Encounter for immunization: Secondary | ICD-10-CM | POA: Diagnosis not present

## 2021-05-13 DIAGNOSIS — J069 Acute upper respiratory infection, unspecified: Secondary | ICD-10-CM | POA: Diagnosis not present

## 2021-05-13 DIAGNOSIS — Z20822 Contact with and (suspected) exposure to covid-19: Secondary | ICD-10-CM | POA: Diagnosis not present

## 2021-05-13 DIAGNOSIS — J029 Acute pharyngitis, unspecified: Secondary | ICD-10-CM | POA: Diagnosis not present

## 2021-05-22 DIAGNOSIS — S20211A Contusion of right front wall of thorax, initial encounter: Secondary | ICD-10-CM | POA: Diagnosis not present

## 2021-06-07 DIAGNOSIS — F3342 Major depressive disorder, recurrent, in full remission: Secondary | ICD-10-CM | POA: Diagnosis not present

## 2021-10-05 DIAGNOSIS — M545 Low back pain, unspecified: Secondary | ICD-10-CM | POA: Diagnosis not present

## 2021-10-05 DIAGNOSIS — M542 Cervicalgia: Secondary | ICD-10-CM | POA: Diagnosis not present

## 2021-10-05 DIAGNOSIS — M25511 Pain in right shoulder: Secondary | ICD-10-CM | POA: Diagnosis not present

## 2021-10-10 DIAGNOSIS — M542 Cervicalgia: Secondary | ICD-10-CM | POA: Diagnosis not present

## 2021-10-10 DIAGNOSIS — M545 Low back pain, unspecified: Secondary | ICD-10-CM | POA: Diagnosis not present

## 2021-10-19 DIAGNOSIS — M791 Myalgia, unspecified site: Secondary | ICD-10-CM | POA: Diagnosis not present

## 2021-10-19 DIAGNOSIS — M542 Cervicalgia: Secondary | ICD-10-CM | POA: Diagnosis not present

## 2021-11-09 DIAGNOSIS — M7918 Myalgia, other site: Secondary | ICD-10-CM | POA: Diagnosis not present

## 2021-11-09 DIAGNOSIS — M542 Cervicalgia: Secondary | ICD-10-CM | POA: Diagnosis not present

## 2021-11-16 DIAGNOSIS — M542 Cervicalgia: Secondary | ICD-10-CM | POA: Diagnosis not present

## 2021-11-16 DIAGNOSIS — M7918 Myalgia, other site: Secondary | ICD-10-CM | POA: Diagnosis not present

## 2021-11-23 DIAGNOSIS — M7918 Myalgia, other site: Secondary | ICD-10-CM | POA: Diagnosis not present

## 2021-11-23 DIAGNOSIS — M542 Cervicalgia: Secondary | ICD-10-CM | POA: Diagnosis not present

## 2021-11-29 DIAGNOSIS — F3341 Major depressive disorder, recurrent, in partial remission: Secondary | ICD-10-CM | POA: Diagnosis not present

## 2021-11-30 DIAGNOSIS — M542 Cervicalgia: Secondary | ICD-10-CM | POA: Diagnosis not present

## 2021-11-30 DIAGNOSIS — M7918 Myalgia, other site: Secondary | ICD-10-CM | POA: Diagnosis not present

## 2021-12-05 DIAGNOSIS — R42 Dizziness and giddiness: Secondary | ICD-10-CM | POA: Diagnosis not present

## 2021-12-05 DIAGNOSIS — H9313 Tinnitus, bilateral: Secondary | ICD-10-CM | POA: Diagnosis not present

## 2021-12-05 DIAGNOSIS — R350 Frequency of micturition: Secondary | ICD-10-CM | POA: Diagnosis not present

## 2021-12-05 DIAGNOSIS — H9191 Unspecified hearing loss, right ear: Secondary | ICD-10-CM | POA: Diagnosis not present

## 2021-12-07 DIAGNOSIS — M7918 Myalgia, other site: Secondary | ICD-10-CM | POA: Diagnosis not present

## 2021-12-07 DIAGNOSIS — M542 Cervicalgia: Secondary | ICD-10-CM | POA: Diagnosis not present

## 2021-12-14 DIAGNOSIS — M542 Cervicalgia: Secondary | ICD-10-CM | POA: Diagnosis not present

## 2021-12-14 DIAGNOSIS — M7918 Myalgia, other site: Secondary | ICD-10-CM | POA: Diagnosis not present

## 2021-12-15 DIAGNOSIS — M542 Cervicalgia: Secondary | ICD-10-CM | POA: Diagnosis not present

## 2021-12-15 DIAGNOSIS — M791 Myalgia, unspecified site: Secondary | ICD-10-CM | POA: Diagnosis not present

## 2021-12-21 DIAGNOSIS — M7918 Myalgia, other site: Secondary | ICD-10-CM | POA: Diagnosis not present

## 2021-12-21 DIAGNOSIS — M542 Cervicalgia: Secondary | ICD-10-CM | POA: Diagnosis not present

## 2021-12-28 DIAGNOSIS — M7918 Myalgia, other site: Secondary | ICD-10-CM | POA: Diagnosis not present

## 2021-12-28 DIAGNOSIS — M542 Cervicalgia: Secondary | ICD-10-CM | POA: Diagnosis not present

## 2022-01-03 DIAGNOSIS — F330 Major depressive disorder, recurrent, mild: Secondary | ICD-10-CM | POA: Diagnosis not present

## 2022-01-04 DIAGNOSIS — M7918 Myalgia, other site: Secondary | ICD-10-CM | POA: Diagnosis not present

## 2022-01-04 DIAGNOSIS — M542 Cervicalgia: Secondary | ICD-10-CM | POA: Diagnosis not present

## 2022-01-10 DIAGNOSIS — U071 COVID-19: Secondary | ICD-10-CM | POA: Diagnosis not present

## 2022-01-30 DIAGNOSIS — F330 Major depressive disorder, recurrent, mild: Secondary | ICD-10-CM | POA: Diagnosis not present

## 2022-02-13 DIAGNOSIS — F330 Major depressive disorder, recurrent, mild: Secondary | ICD-10-CM | POA: Diagnosis not present

## 2022-02-14 DIAGNOSIS — Z1231 Encounter for screening mammogram for malignant neoplasm of breast: Secondary | ICD-10-CM | POA: Diagnosis not present

## 2022-02-15 DIAGNOSIS — M7918 Myalgia, other site: Secondary | ICD-10-CM | POA: Diagnosis not present

## 2022-02-15 DIAGNOSIS — M542 Cervicalgia: Secondary | ICD-10-CM | POA: Diagnosis not present

## 2022-02-20 DIAGNOSIS — M7918 Myalgia, other site: Secondary | ICD-10-CM | POA: Diagnosis not present

## 2022-02-20 DIAGNOSIS — M542 Cervicalgia: Secondary | ICD-10-CM | POA: Diagnosis not present

## 2022-03-01 DIAGNOSIS — M7918 Myalgia, other site: Secondary | ICD-10-CM | POA: Diagnosis not present

## 2022-03-01 DIAGNOSIS — M542 Cervicalgia: Secondary | ICD-10-CM | POA: Diagnosis not present

## 2022-03-06 DIAGNOSIS — F330 Major depressive disorder, recurrent, mild: Secondary | ICD-10-CM | POA: Diagnosis not present

## 2022-03-07 DIAGNOSIS — F418 Other specified anxiety disorders: Secondary | ICD-10-CM | POA: Diagnosis not present

## 2022-03-07 DIAGNOSIS — M810 Age-related osteoporosis without current pathological fracture: Secondary | ICD-10-CM | POA: Diagnosis not present

## 2022-03-07 DIAGNOSIS — R7989 Other specified abnormal findings of blood chemistry: Secondary | ICD-10-CM | POA: Diagnosis not present

## 2022-03-07 DIAGNOSIS — E785 Hyperlipidemia, unspecified: Secondary | ICD-10-CM | POA: Diagnosis not present

## 2022-03-07 DIAGNOSIS — Z1212 Encounter for screening for malignant neoplasm of rectum: Secondary | ICD-10-CM | POA: Diagnosis not present

## 2022-03-07 DIAGNOSIS — I700 Atherosclerosis of aorta: Secondary | ICD-10-CM | POA: Diagnosis not present

## 2022-03-08 DIAGNOSIS — M7918 Myalgia, other site: Secondary | ICD-10-CM | POA: Diagnosis not present

## 2022-03-08 DIAGNOSIS — M542 Cervicalgia: Secondary | ICD-10-CM | POA: Diagnosis not present

## 2022-03-14 DIAGNOSIS — K5900 Constipation, unspecified: Secondary | ICD-10-CM | POA: Diagnosis not present

## 2022-03-14 DIAGNOSIS — E785 Hyperlipidemia, unspecified: Secondary | ICD-10-CM | POA: Diagnosis not present

## 2022-03-14 DIAGNOSIS — Z23 Encounter for immunization: Secondary | ICD-10-CM | POA: Diagnosis not present

## 2022-03-14 DIAGNOSIS — I872 Venous insufficiency (chronic) (peripheral): Secondary | ICD-10-CM | POA: Diagnosis not present

## 2022-03-14 DIAGNOSIS — K219 Gastro-esophageal reflux disease without esophagitis: Secondary | ICD-10-CM | POA: Diagnosis not present

## 2022-03-14 DIAGNOSIS — Z1331 Encounter for screening for depression: Secondary | ICD-10-CM | POA: Diagnosis not present

## 2022-03-14 DIAGNOSIS — M1990 Unspecified osteoarthritis, unspecified site: Secondary | ICD-10-CM | POA: Diagnosis not present

## 2022-03-14 DIAGNOSIS — M810 Age-related osteoporosis without current pathological fracture: Secondary | ICD-10-CM | POA: Diagnosis not present

## 2022-03-14 DIAGNOSIS — Z0000 Encounter for general adult medical examination without abnormal findings: Secondary | ICD-10-CM | POA: Diagnosis not present

## 2022-03-14 DIAGNOSIS — Z1389 Encounter for screening for other disorder: Secondary | ICD-10-CM | POA: Diagnosis not present

## 2022-03-14 DIAGNOSIS — R194 Change in bowel habit: Secondary | ICD-10-CM | POA: Diagnosis not present

## 2022-03-14 DIAGNOSIS — R82998 Other abnormal findings in urine: Secondary | ICD-10-CM | POA: Diagnosis not present

## 2022-03-14 DIAGNOSIS — F325 Major depressive disorder, single episode, in full remission: Secondary | ICD-10-CM | POA: Diagnosis not present

## 2022-03-22 DIAGNOSIS — M542 Cervicalgia: Secondary | ICD-10-CM | POA: Diagnosis not present

## 2022-03-22 DIAGNOSIS — M7918 Myalgia, other site: Secondary | ICD-10-CM | POA: Diagnosis not present

## 2022-03-27 DIAGNOSIS — F330 Major depressive disorder, recurrent, mild: Secondary | ICD-10-CM | POA: Diagnosis not present

## 2022-03-27 DIAGNOSIS — M542 Cervicalgia: Secondary | ICD-10-CM | POA: Diagnosis not present

## 2022-03-27 DIAGNOSIS — M7918 Myalgia, other site: Secondary | ICD-10-CM | POA: Diagnosis not present

## 2022-04-03 DIAGNOSIS — M542 Cervicalgia: Secondary | ICD-10-CM | POA: Diagnosis not present

## 2022-04-03 DIAGNOSIS — M7918 Myalgia, other site: Secondary | ICD-10-CM | POA: Diagnosis not present

## 2022-04-10 DIAGNOSIS — M7918 Myalgia, other site: Secondary | ICD-10-CM | POA: Diagnosis not present

## 2022-04-10 DIAGNOSIS — M542 Cervicalgia: Secondary | ICD-10-CM | POA: Diagnosis not present

## 2022-04-13 DIAGNOSIS — Z23 Encounter for immunization: Secondary | ICD-10-CM | POA: Diagnosis not present

## 2022-04-19 DIAGNOSIS — M542 Cervicalgia: Secondary | ICD-10-CM | POA: Diagnosis not present

## 2022-04-19 DIAGNOSIS — M25472 Effusion, left ankle: Secondary | ICD-10-CM | POA: Diagnosis not present

## 2022-04-19 DIAGNOSIS — F330 Major depressive disorder, recurrent, mild: Secondary | ICD-10-CM | POA: Diagnosis not present

## 2022-04-19 DIAGNOSIS — M7918 Myalgia, other site: Secondary | ICD-10-CM | POA: Diagnosis not present

## 2022-04-24 DIAGNOSIS — F330 Major depressive disorder, recurrent, mild: Secondary | ICD-10-CM | POA: Diagnosis not present

## 2022-06-07 DIAGNOSIS — F330 Major depressive disorder, recurrent, mild: Secondary | ICD-10-CM | POA: Diagnosis not present

## 2022-07-12 DIAGNOSIS — F330 Major depressive disorder, recurrent, mild: Secondary | ICD-10-CM | POA: Diagnosis not present

## 2022-08-07 DIAGNOSIS — M542 Cervicalgia: Secondary | ICD-10-CM | POA: Diagnosis not present

## 2022-08-07 DIAGNOSIS — M791 Myalgia, unspecified site: Secondary | ICD-10-CM | POA: Diagnosis not present

## 2022-08-22 DIAGNOSIS — F330 Major depressive disorder, recurrent, mild: Secondary | ICD-10-CM | POA: Diagnosis not present

## 2022-08-31 DIAGNOSIS — F3342 Major depressive disorder, recurrent, in full remission: Secondary | ICD-10-CM | POA: Diagnosis not present

## 2022-09-05 DIAGNOSIS — E785 Hyperlipidemia, unspecified: Secondary | ICD-10-CM | POA: Diagnosis not present

## 2022-09-19 DIAGNOSIS — J309 Allergic rhinitis, unspecified: Secondary | ICD-10-CM | POA: Diagnosis not present

## 2022-09-27 DIAGNOSIS — F3342 Major depressive disorder, recurrent, in full remission: Secondary | ICD-10-CM | POA: Diagnosis not present

## 2022-11-03 DIAGNOSIS — M25572 Pain in left ankle and joints of left foot: Secondary | ICD-10-CM | POA: Diagnosis not present

## 2022-11-03 DIAGNOSIS — M79645 Pain in left finger(s): Secondary | ICD-10-CM | POA: Diagnosis not present

## 2022-11-03 DIAGNOSIS — M79675 Pain in left toe(s): Secondary | ICD-10-CM | POA: Diagnosis not present

## 2022-11-06 DIAGNOSIS — M545 Low back pain, unspecified: Secondary | ICD-10-CM | POA: Diagnosis not present

## 2022-11-06 DIAGNOSIS — M47816 Spondylosis without myelopathy or radiculopathy, lumbar region: Secondary | ICD-10-CM | POA: Diagnosis not present

## 2022-11-06 DIAGNOSIS — M791 Myalgia, unspecified site: Secondary | ICD-10-CM | POA: Diagnosis not present

## 2022-11-08 DIAGNOSIS — F330 Major depressive disorder, recurrent, mild: Secondary | ICD-10-CM | POA: Diagnosis not present

## 2022-11-14 DIAGNOSIS — M47896 Other spondylosis, lumbar region: Secondary | ICD-10-CM | POA: Diagnosis not present

## 2022-11-14 DIAGNOSIS — M5459 Other low back pain: Secondary | ICD-10-CM | POA: Diagnosis not present

## 2022-11-14 DIAGNOSIS — M25552 Pain in left hip: Secondary | ICD-10-CM | POA: Diagnosis not present

## 2022-11-14 DIAGNOSIS — M7062 Trochanteric bursitis, left hip: Secondary | ICD-10-CM | POA: Diagnosis not present

## 2022-11-16 DIAGNOSIS — M25552 Pain in left hip: Secondary | ICD-10-CM | POA: Diagnosis not present

## 2022-11-16 DIAGNOSIS — M47896 Other spondylosis, lumbar region: Secondary | ICD-10-CM | POA: Diagnosis not present

## 2022-11-16 DIAGNOSIS — M7062 Trochanteric bursitis, left hip: Secondary | ICD-10-CM | POA: Diagnosis not present

## 2022-11-16 DIAGNOSIS — M5459 Other low back pain: Secondary | ICD-10-CM | POA: Diagnosis not present

## 2022-11-22 DIAGNOSIS — M7062 Trochanteric bursitis, left hip: Secondary | ICD-10-CM | POA: Diagnosis not present

## 2022-11-22 DIAGNOSIS — M47896 Other spondylosis, lumbar region: Secondary | ICD-10-CM | POA: Diagnosis not present

## 2022-11-22 DIAGNOSIS — M5459 Other low back pain: Secondary | ICD-10-CM | POA: Diagnosis not present

## 2022-11-22 DIAGNOSIS — M25552 Pain in left hip: Secondary | ICD-10-CM | POA: Diagnosis not present

## 2022-11-24 DIAGNOSIS — M5459 Other low back pain: Secondary | ICD-10-CM | POA: Diagnosis not present

## 2022-11-24 DIAGNOSIS — M47896 Other spondylosis, lumbar region: Secondary | ICD-10-CM | POA: Diagnosis not present

## 2022-11-24 DIAGNOSIS — M7062 Trochanteric bursitis, left hip: Secondary | ICD-10-CM | POA: Diagnosis not present

## 2022-11-24 DIAGNOSIS — M25552 Pain in left hip: Secondary | ICD-10-CM | POA: Diagnosis not present

## 2022-11-28 DIAGNOSIS — M7062 Trochanteric bursitis, left hip: Secondary | ICD-10-CM | POA: Diagnosis not present

## 2022-11-28 DIAGNOSIS — M47896 Other spondylosis, lumbar region: Secondary | ICD-10-CM | POA: Diagnosis not present

## 2022-11-28 DIAGNOSIS — M25552 Pain in left hip: Secondary | ICD-10-CM | POA: Diagnosis not present

## 2022-11-28 DIAGNOSIS — M5459 Other low back pain: Secondary | ICD-10-CM | POA: Diagnosis not present

## 2022-11-30 DIAGNOSIS — M47896 Other spondylosis, lumbar region: Secondary | ICD-10-CM | POA: Diagnosis not present

## 2022-11-30 DIAGNOSIS — M7062 Trochanteric bursitis, left hip: Secondary | ICD-10-CM | POA: Diagnosis not present

## 2022-11-30 DIAGNOSIS — M25552 Pain in left hip: Secondary | ICD-10-CM | POA: Diagnosis not present

## 2022-11-30 DIAGNOSIS — M5459 Other low back pain: Secondary | ICD-10-CM | POA: Diagnosis not present

## 2022-12-05 DIAGNOSIS — M7062 Trochanteric bursitis, left hip: Secondary | ICD-10-CM | POA: Diagnosis not present

## 2022-12-05 DIAGNOSIS — M47896 Other spondylosis, lumbar region: Secondary | ICD-10-CM | POA: Diagnosis not present

## 2022-12-05 DIAGNOSIS — M5459 Other low back pain: Secondary | ICD-10-CM | POA: Diagnosis not present

## 2022-12-05 DIAGNOSIS — M25552 Pain in left hip: Secondary | ICD-10-CM | POA: Diagnosis not present

## 2022-12-11 DIAGNOSIS — M25552 Pain in left hip: Secondary | ICD-10-CM | POA: Diagnosis not present

## 2022-12-11 DIAGNOSIS — M7062 Trochanteric bursitis, left hip: Secondary | ICD-10-CM | POA: Diagnosis not present

## 2022-12-11 DIAGNOSIS — F330 Major depressive disorder, recurrent, mild: Secondary | ICD-10-CM | POA: Diagnosis not present

## 2022-12-11 DIAGNOSIS — M5459 Other low back pain: Secondary | ICD-10-CM | POA: Diagnosis not present

## 2022-12-11 DIAGNOSIS — M47896 Other spondylosis, lumbar region: Secondary | ICD-10-CM | POA: Diagnosis not present

## 2022-12-13 DIAGNOSIS — L821 Other seborrheic keratosis: Secondary | ICD-10-CM | POA: Diagnosis not present

## 2022-12-13 DIAGNOSIS — L570 Actinic keratosis: Secondary | ICD-10-CM | POA: Diagnosis not present

## 2022-12-13 DIAGNOSIS — L820 Inflamed seborrheic keratosis: Secondary | ICD-10-CM | POA: Diagnosis not present

## 2022-12-13 DIAGNOSIS — M7062 Trochanteric bursitis, left hip: Secondary | ICD-10-CM | POA: Diagnosis not present

## 2022-12-13 DIAGNOSIS — M5459 Other low back pain: Secondary | ICD-10-CM | POA: Diagnosis not present

## 2022-12-13 DIAGNOSIS — M47896 Other spondylosis, lumbar region: Secondary | ICD-10-CM | POA: Diagnosis not present

## 2022-12-13 DIAGNOSIS — M25552 Pain in left hip: Secondary | ICD-10-CM | POA: Diagnosis not present

## 2022-12-15 DIAGNOSIS — M47896 Other spondylosis, lumbar region: Secondary | ICD-10-CM | POA: Diagnosis not present

## 2022-12-15 DIAGNOSIS — M5459 Other low back pain: Secondary | ICD-10-CM | POA: Diagnosis not present

## 2022-12-15 DIAGNOSIS — M25552 Pain in left hip: Secondary | ICD-10-CM | POA: Diagnosis not present

## 2022-12-15 DIAGNOSIS — M7062 Trochanteric bursitis, left hip: Secondary | ICD-10-CM | POA: Diagnosis not present

## 2022-12-19 DIAGNOSIS — M7062 Trochanteric bursitis, left hip: Secondary | ICD-10-CM | POA: Diagnosis not present

## 2022-12-19 DIAGNOSIS — M5459 Other low back pain: Secondary | ICD-10-CM | POA: Diagnosis not present

## 2022-12-19 DIAGNOSIS — M47896 Other spondylosis, lumbar region: Secondary | ICD-10-CM | POA: Diagnosis not present

## 2022-12-19 DIAGNOSIS — M25552 Pain in left hip: Secondary | ICD-10-CM | POA: Diagnosis not present

## 2022-12-22 DIAGNOSIS — M47896 Other spondylosis, lumbar region: Secondary | ICD-10-CM | POA: Diagnosis not present

## 2022-12-22 DIAGNOSIS — M25552 Pain in left hip: Secondary | ICD-10-CM | POA: Diagnosis not present

## 2022-12-22 DIAGNOSIS — M5459 Other low back pain: Secondary | ICD-10-CM | POA: Diagnosis not present

## 2022-12-22 DIAGNOSIS — M7062 Trochanteric bursitis, left hip: Secondary | ICD-10-CM | POA: Diagnosis not present

## 2022-12-29 DIAGNOSIS — R1031 Right lower quadrant pain: Secondary | ICD-10-CM | POA: Diagnosis not present

## 2022-12-29 DIAGNOSIS — I700 Atherosclerosis of aorta: Secondary | ICD-10-CM | POA: Diagnosis not present

## 2022-12-29 DIAGNOSIS — K8689 Other specified diseases of pancreas: Secondary | ICD-10-CM | POA: Diagnosis not present

## 2022-12-29 DIAGNOSIS — K760 Fatty (change of) liver, not elsewhere classified: Secondary | ICD-10-CM | POA: Diagnosis not present

## 2022-12-29 DIAGNOSIS — K449 Diaphragmatic hernia without obstruction or gangrene: Secondary | ICD-10-CM | POA: Diagnosis not present

## 2022-12-29 DIAGNOSIS — K429 Umbilical hernia without obstruction or gangrene: Secondary | ICD-10-CM | POA: Diagnosis not present

## 2022-12-29 DIAGNOSIS — N281 Cyst of kidney, acquired: Secondary | ICD-10-CM | POA: Diagnosis not present

## 2023-01-01 DIAGNOSIS — M47816 Spondylosis without myelopathy or radiculopathy, lumbar region: Secondary | ICD-10-CM | POA: Diagnosis not present

## 2023-01-02 DIAGNOSIS — M7062 Trochanteric bursitis, left hip: Secondary | ICD-10-CM | POA: Diagnosis not present

## 2023-01-02 DIAGNOSIS — M47896 Other spondylosis, lumbar region: Secondary | ICD-10-CM | POA: Diagnosis not present

## 2023-01-02 DIAGNOSIS — M25552 Pain in left hip: Secondary | ICD-10-CM | POA: Diagnosis not present

## 2023-01-02 DIAGNOSIS — M5459 Other low back pain: Secondary | ICD-10-CM | POA: Diagnosis not present

## 2023-01-05 ENCOUNTER — Telehealth: Payer: Self-pay | Admitting: Internal Medicine

## 2023-01-05 DIAGNOSIS — M7062 Trochanteric bursitis, left hip: Secondary | ICD-10-CM | POA: Diagnosis not present

## 2023-01-05 DIAGNOSIS — M5459 Other low back pain: Secondary | ICD-10-CM | POA: Diagnosis not present

## 2023-01-05 DIAGNOSIS — M25552 Pain in left hip: Secondary | ICD-10-CM | POA: Diagnosis not present

## 2023-01-05 DIAGNOSIS — M47896 Other spondylosis, lumbar region: Secondary | ICD-10-CM | POA: Diagnosis not present

## 2023-01-05 NOTE — Telephone Encounter (Signed)
Inbound call from patient requesting to speak with the nurse before the end of the day. Patient did not disclose any information in what it was regard. Please advise.

## 2023-01-08 DIAGNOSIS — R197 Diarrhea, unspecified: Secondary | ICD-10-CM | POA: Diagnosis not present

## 2023-01-08 NOTE — Telephone Encounter (Signed)
Left message for pt to call back.  Pt states she has been having diarrhea since 7/21. She has been drinking gatorade, Imodium, and align. Pt stated that today her stool was black. After discussing with pt further she has been taking pepto bismol, explained that the bismuth will turn the stools black. Pt requesting to be seen. Pt scheduled to see Quentin Mulling PA 01/16/23 at 1:30pm. Pt aware of appt.

## 2023-01-10 DIAGNOSIS — M7062 Trochanteric bursitis, left hip: Secondary | ICD-10-CM | POA: Diagnosis not present

## 2023-01-10 DIAGNOSIS — L570 Actinic keratosis: Secondary | ICD-10-CM | POA: Diagnosis not present

## 2023-01-10 DIAGNOSIS — M47896 Other spondylosis, lumbar region: Secondary | ICD-10-CM | POA: Diagnosis not present

## 2023-01-10 DIAGNOSIS — L814 Other melanin hyperpigmentation: Secondary | ICD-10-CM | POA: Diagnosis not present

## 2023-01-10 DIAGNOSIS — M5459 Other low back pain: Secondary | ICD-10-CM | POA: Diagnosis not present

## 2023-01-10 DIAGNOSIS — M25552 Pain in left hip: Secondary | ICD-10-CM | POA: Diagnosis not present

## 2023-01-10 DIAGNOSIS — L821 Other seborrheic keratosis: Secondary | ICD-10-CM | POA: Diagnosis not present

## 2023-01-12 DIAGNOSIS — F330 Major depressive disorder, recurrent, mild: Secondary | ICD-10-CM | POA: Diagnosis not present
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
## 2023-01-16 ENCOUNTER — Other Ambulatory Visit (INDEPENDENT_AMBULATORY_CARE_PROVIDER_SITE_OTHER): Payer: Medicare Other

## 2023-01-16 ENCOUNTER — Ambulatory Visit (INDEPENDENT_AMBULATORY_CARE_PROVIDER_SITE_OTHER): Payer: Medicare Other | Admitting: Physician Assistant

## 2023-01-16 ENCOUNTER — Encounter: Payer: Self-pay | Admitting: Physician Assistant

## 2023-01-16 VITALS — BP 118/60 | HR 97 | Ht 67.0 in | Wt 160.0 lb

## 2023-01-16 DIAGNOSIS — Z86010 Personal history of colonic polyps: Secondary | ICD-10-CM

## 2023-01-16 DIAGNOSIS — A09 Infectious gastroenteritis and colitis, unspecified: Secondary | ICD-10-CM

## 2023-01-16 DIAGNOSIS — K760 Fatty (change of) liver, not elsewhere classified: Secondary | ICD-10-CM | POA: Diagnosis not present

## 2023-01-16 DIAGNOSIS — K8689 Other specified diseases of pancreas: Secondary | ICD-10-CM

## 2023-01-16 LAB — CBC WITH DIFFERENTIAL/PLATELET
Basophils Absolute: 0.1 10*3/uL (ref 0.0–0.1)
Basophils Relative: 0.7 % (ref 0.0–3.0)
Eosinophils Absolute: 0.1 10*3/uL (ref 0.0–0.7)
Eosinophils Relative: 0.9 % (ref 0.0–5.0)
HCT: 41.1 % (ref 36.0–46.0)
Hemoglobin: 13.4 g/dL (ref 12.0–15.0)
Lymphocytes Relative: 25.4 % (ref 12.0–46.0)
Lymphs Abs: 2.1 10*3/uL (ref 0.7–4.0)
MCHC: 32.6 g/dL (ref 30.0–36.0)
MCV: 92.4 fl (ref 78.0–100.0)
Monocytes Absolute: 0.7 10*3/uL (ref 0.1–1.0)
Monocytes Relative: 8.6 % (ref 3.0–12.0)
Neutro Abs: 5.4 10*3/uL (ref 1.4–7.7)
Neutrophils Relative %: 64.4 % (ref 43.0–77.0)
Platelets: 332 10*3/uL (ref 150.0–400.0)
RBC: 4.45 Mil/uL (ref 3.87–5.11)
RDW: 14.1 % (ref 11.5–15.5)
WBC: 8.4 10*3/uL (ref 4.0–10.5)

## 2023-01-16 LAB — COMPREHENSIVE METABOLIC PANEL
ALT: 15 U/L (ref 0–35)
AST: 15 U/L (ref 0–37)
Albumin: 4.2 g/dL (ref 3.5–5.2)
Alkaline Phosphatase: 48 U/L (ref 39–117)
BUN: 14 mg/dL (ref 6–23)
CO2: 30 mEq/L (ref 19–32)
Calcium: 10 mg/dL (ref 8.4–10.5)
Chloride: 103 mEq/L (ref 96–112)
Creatinine, Ser: 0.86 mg/dL (ref 0.40–1.20)
GFR: 64.92 mL/min (ref >60.00)
Glucose, Bld: 78 mg/dL (ref 70–99)
Potassium: 4.5 mEq/L (ref 3.5–5.1)
Sodium: 139 mEq/L (ref 135–145)
Total Bilirubin: 0.4 mg/dL (ref 0.2–1.2)
Total Protein: 6.8 g/dL (ref 6.0–8.3)

## 2023-01-16 LAB — SEDIMENTATION RATE: Sed Rate: 8 mm/hr (ref 0–30)

## 2023-01-16 MED ORDER — NA SULFATE-K SULFATE-MG SULF 17.5-3.13-1.6 GM/177ML PO SOLN: 1.0000 | Freq: Once | ORAL | 0 refills | Status: AC

## 2023-01-16 MED ORDER — COLESTIPOL HCL 1 G PO TABS
2.0000 g | ORAL_TABLET | Freq: Two times a day (BID) | ORAL | 0 refills | Status: AC
Start: 2023-01-16 — End: ?

## 2023-01-16 NOTE — Patient Instructions (Addendum)
Your provider has requested that you go to the basement level for lab work before leaving today. Press "B" on the elevator. The lab is located at the first door on the left as you exit the elevator.  You have been scheduled for a colonoscopy. Please follow written instructions given to you at your visit today.   Please pick up your prep supplies at the pharmacy within the next 1-3 days.  If you use inhalers (even only as needed), please bring them with you on the day of your procedure.  DO NOT TAKE 7 DAYS PRIOR TO TEST- Trulicity (dulaglutide) Ozempic, Wegovy (semaglutide) Mounjaro (tirzepatide) Bydureon Bcise (exanatide extended release)  DO NOT TAKE 1 DAY PRIOR TO YOUR TEST Rybelsus (semaglutide) Adlyxin (lixisenatide) Victoza (liraglutide) Byetta (exanatide) ___________________________________________________________________________   Exocrine Pancreatic Insufficiency (EPI) EPI is a condition in which your body doesn't provide enough pancreatic enzymes to properly digest your food (which can sometimes lead to some unpleasant digestive symptoms such as bloating, AB pain and loose stools, weight loss).   For many people, EPI is also a chronic lifelong condition.   That's why its so important to know what to expect with your EPI treatment, because with the right plan in place, EPI is manageable.  HOW DO I TAKE PANCREATIC ENZYMES?  Pancreatic Enzyme Replacement therapy (Creon) is only available through prescription and cannot be substituted with over the counter alternatives.   Your doctor will personalize your dose based on your weight, diet, and symptoms.  The number of capsules you take per meal will depend on your prescribed dose.  Creon must be taken DURING every meal and snack.   Whether its a full meal or a snack, take Creon every time you eat. Remember to follow your treatment plan closely and take Creon exactly as prescribed - consistency is key!!  IF THIS DOES NOT HELP  TRY THE COLESTID  Dose adjustments are normal with Creon.  Your doctor will start your on the dose they deem appropriate for you.   Remember to follow up with your doctor after the first two weeks to ensure your therapy is effective and you're managing your treatment appropriately.   Metabolic dysfunction associated seatohepatitis  Now the leading cause of liver failure in the [place].  It is normally from such risk factors as obesity, diabetes, insulin resistance, high cholesterol, or metabolic syndrome.  The only definitive therapy is weight loss and exercise.   Suggest walking 20-30 mins daily.  Decreasing carbohydrates, increasing veggies.    Fatty Liver Fatty liver is the accumulation of fat in liver cells. It is also called hepatosteatosis or steatohepatitis. It is normal for your liver to contain some fat. If fat is more than 5 to 10% of your liver's weight, you have fatty liver.  There are often no symptoms (problems) for years while damage is still occurring. People often learn about their fatty liver when they have medical tests for other reasons. Fat can damage your liver for years or even decades without causing problems. When it becomes severe, it can cause fatigue, weight loss, weakness, and confusion. This makes you more likely to develop more serious liver problems. The liver is the largest organ in the body. It does a lot of work and often gives no warning signs when it is sick until late in a disease. The liver has many important jobs including: Breaking down foods. Storing vitamins, iron, and other minerals. Making proteins. Making bile for food digestion. Breaking down many products  including medications, alcohol and some poisons.  PROGNOSIS  Fatty liver may cause no damage or it can lead to an inflammation of the liver. This is, called steatohepatitis.  Over time the liver may become scarred and hardened. This condition is called cirrhosis. Cirrhosis is serious  and may lead to liver failure or cancer. NASH is one of the leading causes of cirrhosis. About 10-20% of Americans have fatty liver and a smaller 2-5% has NASH.  TREATMENT  Weight loss, fat restriction, and exercise in overweight patients produces inconsistent results but is worth trying. Good control of diabetes may reduce fatty liver. Eat a balanced, healthy diet. Increase your physical activity. There are no medical or surgical treatments for a fatty liver or NASH, but improving your diet and increasing your exercise may help prevent or reverse some of the damage.

## 2023-01-16 NOTE — Progress Notes (Signed)
Noted  

## 2023-01-16 NOTE — Addendum Note (Signed)
Addended by: Ines Bloomer R on: 01/16/2023 01:27 PM   Modules accepted: Orders

## 2023-01-17 ENCOUNTER — Other Ambulatory Visit: Payer: Medicare Other

## 2023-01-17 DIAGNOSIS — A09 Infectious gastroenteritis and colitis, unspecified: Secondary | ICD-10-CM | POA: Diagnosis not present

## 2023-01-22 ENCOUNTER — Telehealth: Payer: Self-pay | Admitting: Physician Assistant

## 2023-01-22 DIAGNOSIS — M47896 Other spondylosis, lumbar region: Secondary | ICD-10-CM | POA: Diagnosis not present

## 2023-01-22 DIAGNOSIS — M25552 Pain in left hip: Secondary | ICD-10-CM | POA: Diagnosis not present

## 2023-01-22 DIAGNOSIS — M7062 Trochanteric bursitis, left hip: Secondary | ICD-10-CM | POA: Diagnosis not present

## 2023-01-22 DIAGNOSIS — M5459 Other low back pain: Secondary | ICD-10-CM | POA: Diagnosis not present

## 2023-01-22 NOTE — Telephone Encounter (Signed)
Negative Diatherix test for infection in the stool.  Please return the pancreatic elastase and continue on with the colonoscopy with Dr. Marina Goodell.  Sent patient a my chart message, will call later in the week if she does not read it.

## 2023-01-25 LAB — PANCREATIC ELASTASE, FECAL: Pancreatic Elastase-1, Stool: 175 ug/g — ABNORMAL LOW

## 2023-01-26 DIAGNOSIS — M47896 Other spondylosis, lumbar region: Secondary | ICD-10-CM | POA: Diagnosis not present

## 2023-01-26 DIAGNOSIS — M7062 Trochanteric bursitis, left hip: Secondary | ICD-10-CM | POA: Diagnosis not present

## 2023-01-26 DIAGNOSIS — M25552 Pain in left hip: Secondary | ICD-10-CM | POA: Diagnosis not present

## 2023-01-26 DIAGNOSIS — M5459 Other low back pain: Secondary | ICD-10-CM | POA: Diagnosis not present

## 2023-01-29 ENCOUNTER — Other Ambulatory Visit: Payer: Self-pay

## 2023-01-29 ENCOUNTER — Telehealth: Payer: Self-pay | Admitting: Physician Assistant

## 2023-01-29 DIAGNOSIS — M25552 Pain in left hip: Secondary | ICD-10-CM | POA: Diagnosis not present

## 2023-01-29 DIAGNOSIS — K8689 Other specified diseases of pancreas: Secondary | ICD-10-CM

## 2023-01-29 DIAGNOSIS — M7062 Trochanteric bursitis, left hip: Secondary | ICD-10-CM | POA: Diagnosis not present

## 2023-01-29 DIAGNOSIS — M47896 Other spondylosis, lumbar region: Secondary | ICD-10-CM | POA: Diagnosis not present

## 2023-01-29 DIAGNOSIS — K760 Fatty (change of) liver, not elsewhere classified: Secondary | ICD-10-CM

## 2023-01-29 DIAGNOSIS — M5459 Other low back pain: Secondary | ICD-10-CM | POA: Diagnosis not present

## 2023-01-29 MED ORDER — PANCRELIPASE (LIP-PROT-AMYL) 36000-114000 UNITS PO CPEP: ORAL_CAPSULE | ORAL | 3 refills | Status: AC

## 2023-01-29 NOTE — Telephone Encounter (Signed)
Pt notified via mychart

## 2023-01-29 NOTE — Telephone Encounter (Signed)
Inbound call from patient stating she had a CT pelvis with contrast scan completed on 7/26 through [facility]. Patient requesting to know if she should continue with having another one. Please advise, thank you.

## 2023-01-29 NOTE — Addendum Note (Signed)
Addended by: Quentin Mulling on: 01/29/2023 12:47 PM   Modules accepted: Orders

## 2023-01-30 ENCOUNTER — Telehealth: Payer: Self-pay

## 2023-01-30 NOTE — Telephone Encounter (Signed)
Pt prescribed Creon. States it is to expensive and she cannot afford it. Not sure if she needs a PA or patient assistance.

## 2023-01-31 ENCOUNTER — Other Ambulatory Visit (HOSPITAL_COMMUNITY): Payer: Self-pay

## 2023-01-31 DIAGNOSIS — M47896 Other spondylosis, lumbar region: Secondary | ICD-10-CM | POA: Diagnosis not present

## 2023-01-31 DIAGNOSIS — M7062 Trochanteric bursitis, left hip: Secondary | ICD-10-CM | POA: Diagnosis not present

## 2023-01-31 DIAGNOSIS — M5459 Other low back pain: Secondary | ICD-10-CM | POA: Diagnosis not present

## 2023-01-31 DIAGNOSIS — M25552 Pain in left hip: Secondary | ICD-10-CM | POA: Diagnosis not present

## 2023-01-31 NOTE — Telephone Encounter (Signed)
Gave pt creon samples but does not appear she will be able to afford it.

## 2023-01-31 NOTE — Telephone Encounter (Signed)
Per test claims PA is not needed, patient is in the Initial Benefit phase of her plan and would need to contact them for more information.

## 2023-02-01 NOTE — Telephone Encounter (Signed)
Doree Albee, PA-C  Gianah Batt, Candy Sledge, RN Caller: Unspecified (2 days ago,  4:13 PM) Can try do the patient assistance or she continue with the clostebol if it is helping some. Marchelle Folks    Please try to get patient assistance for this pt for the creon.

## 2023-02-05 DIAGNOSIS — M47896 Other spondylosis, lumbar region: Secondary | ICD-10-CM | POA: Diagnosis not present

## 2023-02-05 DIAGNOSIS — M7062 Trochanteric bursitis, left hip: Secondary | ICD-10-CM | POA: Diagnosis not present

## 2023-02-05 DIAGNOSIS — M25552 Pain in left hip: Secondary | ICD-10-CM | POA: Diagnosis not present

## 2023-02-05 DIAGNOSIS — M5459 Other low back pain: Secondary | ICD-10-CM | POA: Diagnosis not present

## 2023-02-07 ENCOUNTER — Other Ambulatory Visit (HOSPITAL_COMMUNITY): Payer: Self-pay

## 2023-02-07 DIAGNOSIS — L821 Other seborrheic keratosis: Secondary | ICD-10-CM | POA: Diagnosis not present

## 2023-02-07 DIAGNOSIS — L820 Inflamed seborrheic keratosis: Secondary | ICD-10-CM | POA: Diagnosis not present

## 2023-02-07 DIAGNOSIS — L570 Actinic keratosis: Secondary | ICD-10-CM | POA: Diagnosis not present

## 2023-02-07 DIAGNOSIS — D692 Other nonthrombocytopenic purpura: Secondary | ICD-10-CM | POA: Diagnosis not present

## 2023-02-07 NOTE — Telephone Encounter (Signed)
I called patient verified consent, and filled out the patient's portion. I have faxed over the application to the office [phone] for completion of form.

## 2023-02-09 DIAGNOSIS — M5459 Other low back pain: Secondary | ICD-10-CM | POA: Diagnosis not present

## 2023-02-09 DIAGNOSIS — M47896 Other spondylosis, lumbar region: Secondary | ICD-10-CM | POA: Diagnosis not present

## 2023-02-09 DIAGNOSIS — F331 Major depressive disorder, recurrent, moderate: Secondary | ICD-10-CM | POA: Diagnosis not present

## 2023-02-09 DIAGNOSIS — M7062 Trochanteric bursitis, left hip: Secondary | ICD-10-CM | POA: Diagnosis not present

## 2023-02-09 DIAGNOSIS — M25552 Pain in left hip: Secondary | ICD-10-CM | POA: Diagnosis not present

## 2023-02-12 DIAGNOSIS — M5459 Other low back pain: Secondary | ICD-10-CM | POA: Diagnosis not present

## 2023-02-12 DIAGNOSIS — M7062 Trochanteric bursitis, left hip: Secondary | ICD-10-CM | POA: Diagnosis not present

## 2023-02-12 DIAGNOSIS — M47896 Other spondylosis, lumbar region: Secondary | ICD-10-CM | POA: Diagnosis not present

## 2023-02-12 DIAGNOSIS — M25552 Pain in left hip: Secondary | ICD-10-CM | POA: Diagnosis not present

## 2023-02-12 NOTE — Telephone Encounter (Signed)
Have you received the paperwork you needed for the Creon. Pt is running out of the samples she has and needs to order med. Please advise.

## 2023-02-14 NOTE — Telephone Encounter (Signed)
I can check on this to see if Marchelle Folks has signed the application so it can be faxed.

## 2023-02-14 NOTE — Telephone Encounter (Signed)
Hey good morning, I responded via another message. Did you guys receive the fax? If so, please fill out your portion and send it back to me.

## 2023-02-15 NOTE — Telephone Encounter (Signed)
Marchelle Folks is out of the office for the next few days. I will have her to fill out her portions of the application when she returns.

## 2023-02-16 DIAGNOSIS — M7062 Trochanteric bursitis, left hip: Secondary | ICD-10-CM | POA: Diagnosis not present

## 2023-02-16 DIAGNOSIS — M5459 Other low back pain: Secondary | ICD-10-CM | POA: Diagnosis not present

## 2023-02-16 DIAGNOSIS — M25552 Pain in left hip: Secondary | ICD-10-CM | POA: Diagnosis not present

## 2023-02-16 DIAGNOSIS — M47896 Other spondylosis, lumbar region: Secondary | ICD-10-CM | POA: Diagnosis not present

## 2023-02-19 ENCOUNTER — Ambulatory Visit (AMBULATORY_SURGERY_CENTER): Payer: Medicare Other | Admitting: Internal Medicine

## 2023-02-19 ENCOUNTER — Encounter: Payer: Self-pay | Admitting: Internal Medicine

## 2023-02-19 VITALS — BP 119/63 | HR 73 | Temp 97.5°F | Resp 11 | Ht 67.0 in | Wt 160.0 lb

## 2023-02-19 DIAGNOSIS — Z1211 Encounter for screening for malignant neoplasm of colon: Secondary | ICD-10-CM

## 2023-02-19 DIAGNOSIS — R197 Diarrhea, unspecified: Secondary | ICD-10-CM | POA: Diagnosis not present

## 2023-02-19 DIAGNOSIS — F419 Anxiety disorder, unspecified: Secondary | ICD-10-CM | POA: Diagnosis not present

## 2023-02-19 DIAGNOSIS — F32A Depression, unspecified: Secondary | ICD-10-CM | POA: Diagnosis not present

## 2023-02-19 DIAGNOSIS — K648 Other hemorrhoids: Secondary | ICD-10-CM | POA: Diagnosis not present

## 2023-02-19 MED ORDER — SODIUM CHLORIDE 0.9 % IV SOLN: 500.0000 mL | Freq: Once | INTRAVENOUS | Status: DC

## 2023-02-19 NOTE — Patient Instructions (Addendum)
Handouts provided on diverticulosis and hemorrhoids.  Resume previous diet.  Continue present medications.  Await pathology results.  Repeat colonoscopy is not recommended for surveillance.   YOU HAD AN ENDOSCOPIC PROCEDURE TODAY AT THE [facility]:   Refer to the procedure report that was given to you for any specific questions about what was found during the examination.  If the procedure report does not answer your questions, please call your gastroenterologist to clarify.  If you requested that your care partner not be given the details of your procedure findings, then the procedure report has been included in a sealed envelope for you to review at your convenience later.  YOU SHOULD EXPECT: Some feelings of bloating in the abdomen. Passage of more gas than usual.  Walking can help get rid of the air that was put into your GI tract during the procedure and reduce the bloating. If you had a lower endoscopy (such as a colonoscopy or flexible sigmoidoscopy) you may notice spotting of blood in your stool or on the toilet paper. If you underwent a bowel prep for your procedure, you may not have a normal bowel movement for a few days.  Please Note:  You might notice some irritation and congestion in your nose or some drainage.  This is from the oxygen used during your procedure.  There is no need for concern and it should clear up in a day or so.  SYMPTOMS TO REPORT IMMEDIATELY:  Following lower endoscopy (colonoscopy or flexible sigmoidoscopy):  Excessive amounts of blood in the stool  Significant tenderness or worsening of abdominal pains  Swelling of the abdomen that is new, acute  Fever of 100F or higher  For urgent or emergent issues, a gastroenterologist can be reached at any hour by calling [phone]. Do not use MyChart messaging for urgent concerns.    DIET:  We do recommend a small meal at first, but then you may proceed to your regular diet.  Drink plenty of fluids  but you should avoid alcoholic beverages for 24 hours.  ACTIVITY:  You should plan to take it easy for the rest of today and you should NOT DRIVE or use heavy machinery until tomorrow (because of the sedation medicines used during the test).    FOLLOW UP: Our staff will call the number listed on your records the next business day following your procedure.  We will call around 7:15- 8:00 am to check on you and address any questions or concerns that you may have regarding the information given to you following your procedure. If we do not reach you, we will leave a message.     If any biopsies were taken you will be contacted by phone or by letter within the next 1-3 weeks.  Please call us at [phone] if you have not heard about the biopsies in 3 weeks.    SIGNATURES/CONFIDENTIALITY: You and/or your care partner have signed paperwork which will be entered into your electronic medical record.  These signatures attest to the fact that that the information above on your After Visit Summary has been reviewed and is understood.  Full responsibility of the confidentiality of this discharge information lies with you and/or your care-partner.

## 2023-02-19 NOTE — Progress Notes (Unsigned)
To pacu, VSS. Report to [email or web address] 
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
## 2023-02-19 NOTE — Op Note (Signed)
[facility] Patient Name: Tracy Rhodes Procedure Date: 02/19/2023 2:33 PM MRN: [id number] Endoscopist: Wilhemina Bonito. Marina Goodell , MD, [id number] Age: 77 Referring MD:  Date of Birth: 25-Sep-1945 Gender: Female Account #: [id number] Procedure:                Colonoscopy with biopsies Indications:              Colon canncer screening. Prior exams 2008, 2015.                            Clinically significant diarrhea of unexplained                            origin (seen in office, Now better off creon and                            colestid) Medicines:                Monitored Anesthesia Care Procedure:                Pre-Anesthesia Assessment:                           - Prior to the procedure, a History and Physical                            was performed, and patient medications and                            allergies were reviewed. The patient's tolerance of                            previous anesthesia was also reviewed. The risks                            and benefits of the procedure and the sedation                            options and risks were discussed with the patient.                            All questions were answered, and informed consent                            was obtained. Prior Anticoagulants: The patient has                            taken no anticoagulant or antiplatelet agents. ASA                            Grade Assessment: II - A patient with mild systemic                            disease. After reviewing the risks and benefits,  the patient was deemed in satisfactory condition to                            undergo the procedure.                           After obtaining informed consent, the colonoscope                            was passed under direct vision. Throughout the                            procedure, the patient's blood pressure, pulse, and                            oxygen saturations were monitored  continuously. The                            CF [id number] #[id number] was introduced through the anus                            and advanced to the the cecum, identified by                            appendiceal orifice and ileocecal valve. The                            ileocecal valve, appendiceal orifice, and rectum                            were photographed. The quality of the bowel                            preparation was excellent. The colonoscopy was                            performed without difficulty. The patient tolerated                            the procedure well. The bowel preparation used was                            SUPREP via split dose instruction. Scope In: 2:46:55 PM Scope Out: 2:57:46 PM Scope Withdrawal Time: 0 hours 8 minutes 30 seconds  Total Procedure Duration: 0 hours 10 minutes 51 seconds  Findings:                 A few small-mouthed diverticula were found in the                            sigmoid colon.                           Internal hemorrhoids were found during  retroflexion. The hemorrhoids were small.                           The exam was otherwise without abnormality on                            direct and retroflexion views.                           Biopsies for histology were taken with a cold                            forceps from the entire colon for evaluation of                            microscopic colitis. Complications:            No immediate complications. Estimated blood loss:                            None. Estimated Blood Loss:     Estimated blood loss: none. Recommendation:           - Repeat colonoscopy is not recommended for                            surveillance.                           - Patient has a contact number available for                            emergencies. The signs and symptoms of potential                            delayed complications were discussed with the                             patient. Return to normal activities tomorrow.                            Written discharge instructions were provided to the                            patient.                           - Resume previous diet.                           - Continue present medications.                           - Await pathology results. Wilhemina Bonito. Marina Goodell, MD 02/19/2023 3:07:35 PM This report has been signed electronically.

## 2023-02-19 NOTE — Progress Notes (Unsigned)
Called to room to assist during endoscopic procedure.  Patient ID and intended procedure confirmed with present staff. Received instructions for my participation in the procedure from the performing physician.  

## 2023-02-20 ENCOUNTER — Telehealth: Payer: Self-pay

## 2023-02-20 ENCOUNTER — Encounter: Payer: Self-pay | Admitting: Internal Medicine

## 2023-02-20 NOTE — Telephone Encounter (Signed)
  Follow up Call-     02/19/2023    1:43 PM  Call back number  Post procedure Call Back phone  # [phone]  Permission to leave phone message Yes     Patient questions:  Do you have a fever, pain , or abdominal swelling? No. Pain Score  0 *  Have you tolerated food without any problems? Yes.    Have you been able to return to your normal activities? Yes.    Do you have any questions about your discharge instructions: Diet   No. Medications  No. Follow up visit  No.  Do you have questions or concerns about your Care? No.  Actions: * If pain score is 4 or above: No action needed, pain <4.

## 2023-02-22 ENCOUNTER — Encounter: Payer: Self-pay | Admitting: Internal Medicine

## 2023-02-22 LAB — SURGICAL PATHOLOGY

## 2023-02-23 DIAGNOSIS — Z1231 Encounter for screening mammogram for malignant neoplasm of breast: Secondary | ICD-10-CM | POA: Diagnosis not present

## 2023-02-26 DIAGNOSIS — M7062 Trochanteric bursitis, left hip: Secondary | ICD-10-CM | POA: Diagnosis not present

## 2023-02-26 DIAGNOSIS — M25552 Pain in left hip: Secondary | ICD-10-CM | POA: Diagnosis not present

## 2023-02-26 DIAGNOSIS — M25562 Pain in left knee: Secondary | ICD-10-CM | POA: Diagnosis not present

## 2023-02-26 DIAGNOSIS — M47896 Other spondylosis, lumbar region: Secondary | ICD-10-CM | POA: Diagnosis not present

## 2023-02-26 DIAGNOSIS — M5459 Other low back pain: Secondary | ICD-10-CM | POA: Diagnosis not present

## 2023-03-02 ENCOUNTER — Telehealth: Payer: Self-pay

## 2023-03-02 DIAGNOSIS — M7062 Trochanteric bursitis, left hip: Secondary | ICD-10-CM | POA: Diagnosis not present

## 2023-03-02 DIAGNOSIS — M25552 Pain in left hip: Secondary | ICD-10-CM | POA: Diagnosis not present

## 2023-03-02 DIAGNOSIS — M5459 Other low back pain: Secondary | ICD-10-CM | POA: Diagnosis not present

## 2023-03-02 DIAGNOSIS — M47896 Other spondylosis, lumbar region: Secondary | ICD-10-CM | POA: Diagnosis not present

## 2023-03-02 NOTE — Telephone Encounter (Signed)
Spoke with patient, she no longer wishes to proceed with patient assist with creon, no longer has diarrhea.

## 2023-03-02 NOTE — Telephone Encounter (Signed)
Called and left voicemail for patient to come to the office to sign papers for patient assist with creon.

## 2023-03-05 DIAGNOSIS — M5459 Other low back pain: Secondary | ICD-10-CM | POA: Diagnosis not present

## 2023-03-05 DIAGNOSIS — M7062 Trochanteric bursitis, left hip: Secondary | ICD-10-CM | POA: Diagnosis not present

## 2023-03-05 DIAGNOSIS — M25552 Pain in left hip: Secondary | ICD-10-CM | POA: Diagnosis not present

## 2023-03-05 DIAGNOSIS — M47896 Other spondylosis, lumbar region: Secondary | ICD-10-CM | POA: Diagnosis not present

## 2023-03-09 DIAGNOSIS — M47896 Other spondylosis, lumbar region: Secondary | ICD-10-CM | POA: Diagnosis not present

## 2023-03-09 DIAGNOSIS — M25552 Pain in left hip: Secondary | ICD-10-CM | POA: Diagnosis not present

## 2023-03-09 DIAGNOSIS — M5459 Other low back pain: Secondary | ICD-10-CM | POA: Diagnosis not present

## 2023-03-09 DIAGNOSIS — M7062 Trochanteric bursitis, left hip: Secondary | ICD-10-CM | POA: Diagnosis not present

## 2023-03-09 DIAGNOSIS — M25562 Pain in left knee: Secondary | ICD-10-CM | POA: Diagnosis not present

## 2023-03-12 DIAGNOSIS — M5459 Other low back pain: Secondary | ICD-10-CM | POA: Diagnosis not present

## 2023-03-12 DIAGNOSIS — M25552 Pain in left hip: Secondary | ICD-10-CM | POA: Diagnosis not present

## 2023-03-12 DIAGNOSIS — M25562 Pain in left knee: Secondary | ICD-10-CM | POA: Diagnosis not present

## 2023-03-12 DIAGNOSIS — M7062 Trochanteric bursitis, left hip: Secondary | ICD-10-CM | POA: Diagnosis not present

## 2023-03-12 DIAGNOSIS — M47896 Other spondylosis, lumbar region: Secondary | ICD-10-CM | POA: Diagnosis not present

## 2023-03-13 DIAGNOSIS — Z23 Encounter for immunization: Secondary | ICD-10-CM | POA: Diagnosis not present

## 2023-03-14 DIAGNOSIS — F331 Major depressive disorder, recurrent, moderate: Secondary | ICD-10-CM | POA: Diagnosis not present

## 2023-03-16 DIAGNOSIS — M47896 Other spondylosis, lumbar region: Secondary | ICD-10-CM | POA: Diagnosis not present

## 2023-03-16 DIAGNOSIS — M5459 Other low back pain: Secondary | ICD-10-CM | POA: Diagnosis not present

## 2023-03-16 DIAGNOSIS — M7062 Trochanteric bursitis, left hip: Secondary | ICD-10-CM | POA: Diagnosis not present

## 2023-03-16 DIAGNOSIS — M25552 Pain in left hip: Secondary | ICD-10-CM | POA: Diagnosis not present

## 2023-03-16 DIAGNOSIS — M25562 Pain in left knee: Secondary | ICD-10-CM | POA: Diagnosis not present

## 2023-03-19 DIAGNOSIS — M47896 Other spondylosis, lumbar region: Secondary | ICD-10-CM | POA: Diagnosis not present

## 2023-03-19 DIAGNOSIS — M5459 Other low back pain: Secondary | ICD-10-CM | POA: Diagnosis not present

## 2023-03-19 DIAGNOSIS — M25562 Pain in left knee: Secondary | ICD-10-CM | POA: Diagnosis not present

## 2023-03-19 DIAGNOSIS — M7062 Trochanteric bursitis, left hip: Secondary | ICD-10-CM | POA: Diagnosis not present

## 2023-03-19 DIAGNOSIS — M25552 Pain in left hip: Secondary | ICD-10-CM | POA: Diagnosis not present

## 2023-03-23 DIAGNOSIS — M5459 Other low back pain: Secondary | ICD-10-CM | POA: Diagnosis not present

## 2023-03-23 DIAGNOSIS — M25552 Pain in left hip: Secondary | ICD-10-CM | POA: Diagnosis not present

## 2023-03-23 DIAGNOSIS — E785 Hyperlipidemia, unspecified: Secondary | ICD-10-CM | POA: Diagnosis not present

## 2023-03-23 DIAGNOSIS — M7062 Trochanteric bursitis, left hip: Secondary | ICD-10-CM | POA: Diagnosis not present

## 2023-03-23 DIAGNOSIS — M47896 Other spondylosis, lumbar region: Secondary | ICD-10-CM | POA: Diagnosis not present

## 2023-03-23 DIAGNOSIS — M810 Age-related osteoporosis without current pathological fracture: Secondary | ICD-10-CM | POA: Diagnosis not present

## 2023-03-23 DIAGNOSIS — M25562 Pain in left knee: Secondary | ICD-10-CM | POA: Diagnosis not present

## 2023-03-23 DIAGNOSIS — Z1389 Encounter for screening for other disorder: Secondary | ICD-10-CM | POA: Diagnosis not present

## 2023-03-26 DIAGNOSIS — M5459 Other low back pain: Secondary | ICD-10-CM | POA: Diagnosis not present

## 2023-03-26 DIAGNOSIS — M7062 Trochanteric bursitis, left hip: Secondary | ICD-10-CM | POA: Diagnosis not present

## 2023-03-26 DIAGNOSIS — M47896 Other spondylosis, lumbar region: Secondary | ICD-10-CM | POA: Diagnosis not present

## 2023-03-26 DIAGNOSIS — F3341 Major depressive disorder, recurrent, in partial remission: Secondary | ICD-10-CM | POA: Diagnosis not present

## 2023-03-26 DIAGNOSIS — M25562 Pain in left knee: Secondary | ICD-10-CM | POA: Diagnosis not present

## 2023-03-26 DIAGNOSIS — M25552 Pain in left hip: Secondary | ICD-10-CM | POA: Diagnosis not present

## 2023-03-27 ENCOUNTER — Ambulatory Visit: Payer: Medicare Other | Admitting: Internal Medicine

## 2023-03-28 DIAGNOSIS — L309 Dermatitis, unspecified: Secondary | ICD-10-CM | POA: Diagnosis not present

## 2023-03-30 DIAGNOSIS — E785 Hyperlipidemia, unspecified: Secondary | ICD-10-CM | POA: Diagnosis not present

## 2023-03-30 DIAGNOSIS — M7062 Trochanteric bursitis, left hip: Secondary | ICD-10-CM | POA: Diagnosis not present

## 2023-03-30 DIAGNOSIS — M25562 Pain in left knee: Secondary | ICD-10-CM | POA: Diagnosis not present

## 2023-03-30 DIAGNOSIS — N3941 Urge incontinence: Secondary | ICD-10-CM | POA: Diagnosis not present

## 2023-03-30 DIAGNOSIS — M810 Age-related osteoporosis without current pathological fracture: Secondary | ICD-10-CM | POA: Diagnosis not present

## 2023-03-30 DIAGNOSIS — M47896 Other spondylosis, lumbar region: Secondary | ICD-10-CM | POA: Diagnosis not present

## 2023-03-30 DIAGNOSIS — R194 Change in bowel habit: Secondary | ICD-10-CM | POA: Diagnosis not present

## 2023-03-30 DIAGNOSIS — M1990 Unspecified osteoarthritis, unspecified site: Secondary | ICD-10-CM | POA: Diagnosis not present

## 2023-03-30 DIAGNOSIS — Z1339 Encounter for screening examination for other mental health and behavioral disorders: Secondary | ICD-10-CM | POA: Diagnosis not present

## 2023-03-30 DIAGNOSIS — F325 Major depressive disorder, single episode, in full remission: Secondary | ICD-10-CM | POA: Diagnosis not present

## 2023-03-30 DIAGNOSIS — I700 Atherosclerosis of aorta: Secondary | ICD-10-CM | POA: Diagnosis not present

## 2023-03-30 DIAGNOSIS — F418 Other specified anxiety disorders: Secondary | ICD-10-CM | POA: Diagnosis not present

## 2023-03-30 DIAGNOSIS — Z0000 Encounter for general adult medical examination without abnormal findings: Secondary | ICD-10-CM | POA: Diagnosis not present

## 2023-03-30 DIAGNOSIS — N6019 Diffuse cystic mastopathy of unspecified breast: Secondary | ICD-10-CM | POA: Diagnosis not present

## 2023-03-30 DIAGNOSIS — M25552 Pain in left hip: Secondary | ICD-10-CM | POA: Diagnosis not present

## 2023-03-30 DIAGNOSIS — Z1389 Encounter for screening for other disorder: Secondary | ICD-10-CM | POA: Diagnosis not present

## 2023-03-30 DIAGNOSIS — Z1331 Encounter for screening for depression: Secondary | ICD-10-CM | POA: Diagnosis not present

## 2023-03-30 DIAGNOSIS — R82998 Other abnormal findings in urine: Secondary | ICD-10-CM | POA: Diagnosis not present

## 2023-03-30 DIAGNOSIS — M5459 Other low back pain: Secondary | ICD-10-CM | POA: Diagnosis not present

## 2023-04-02 DIAGNOSIS — M5459 Other low back pain: Secondary | ICD-10-CM | POA: Diagnosis not present

## 2023-04-02 DIAGNOSIS — M47896 Other spondylosis, lumbar region: Secondary | ICD-10-CM | POA: Diagnosis not present

## 2023-04-02 DIAGNOSIS — M25552 Pain in left hip: Secondary | ICD-10-CM | POA: Diagnosis not present

## 2023-04-02 DIAGNOSIS — M7062 Trochanteric bursitis, left hip: Secondary | ICD-10-CM | POA: Diagnosis not present

## 2023-04-02 DIAGNOSIS — M25562 Pain in left knee: Secondary | ICD-10-CM | POA: Diagnosis not present

## 2023-04-05 DIAGNOSIS — M25552 Pain in left hip: Secondary | ICD-10-CM | POA: Diagnosis not present

## 2023-04-05 DIAGNOSIS — M25562 Pain in left knee: Secondary | ICD-10-CM | POA: Diagnosis not present

## 2023-04-05 DIAGNOSIS — M5459 Other low back pain: Secondary | ICD-10-CM | POA: Diagnosis not present

## 2023-04-05 DIAGNOSIS — M47896 Other spondylosis, lumbar region: Secondary | ICD-10-CM | POA: Diagnosis not present

## 2023-04-05 DIAGNOSIS — M7062 Trochanteric bursitis, left hip: Secondary | ICD-10-CM | POA: Diagnosis not present

## 2023-04-09 DIAGNOSIS — M25552 Pain in left hip: Secondary | ICD-10-CM | POA: Diagnosis not present

## 2023-04-09 DIAGNOSIS — M7062 Trochanteric bursitis, left hip: Secondary | ICD-10-CM | POA: Diagnosis not present

## 2023-04-09 DIAGNOSIS — M25562 Pain in left knee: Secondary | ICD-10-CM | POA: Diagnosis not present

## 2023-04-09 DIAGNOSIS — M47896 Other spondylosis, lumbar region: Secondary | ICD-10-CM | POA: Diagnosis not present

## 2023-04-09 DIAGNOSIS — M5459 Other low back pain: Secondary | ICD-10-CM | POA: Diagnosis not present

## 2023-04-11 DIAGNOSIS — L821 Other seborrheic keratosis: Secondary | ICD-10-CM | POA: Diagnosis not present

## 2023-04-11 DIAGNOSIS — L820 Inflamed seborrheic keratosis: Secondary | ICD-10-CM | POA: Diagnosis not present

## 2023-04-11 DIAGNOSIS — L814 Other melanin hyperpigmentation: Secondary | ICD-10-CM | POA: Diagnosis not present

## 2023-04-11 DIAGNOSIS — L570 Actinic keratosis: Secondary | ICD-10-CM | POA: Diagnosis not present

## 2023-04-12 DIAGNOSIS — F3341 Major depressive disorder, recurrent, in partial remission: Secondary | ICD-10-CM | POA: Diagnosis not present

## 2023-04-13 DIAGNOSIS — M25562 Pain in left knee: Secondary | ICD-10-CM | POA: Diagnosis not present

## 2023-04-13 DIAGNOSIS — M25552 Pain in left hip: Secondary | ICD-10-CM | POA: Diagnosis not present

## 2023-04-13 DIAGNOSIS — M47896 Other spondylosis, lumbar region: Secondary | ICD-10-CM | POA: Diagnosis not present

## 2023-04-13 DIAGNOSIS — M7062 Trochanteric bursitis, left hip: Secondary | ICD-10-CM | POA: Diagnosis not present

## 2023-04-13 DIAGNOSIS — M5459 Other low back pain: Secondary | ICD-10-CM | POA: Diagnosis not present

## 2023-04-16 DIAGNOSIS — M47896 Other spondylosis, lumbar region: Secondary | ICD-10-CM | POA: Diagnosis not present

## 2023-04-16 DIAGNOSIS — M25552 Pain in left hip: Secondary | ICD-10-CM | POA: Diagnosis not present

## 2023-04-16 DIAGNOSIS — M7062 Trochanteric bursitis, left hip: Secondary | ICD-10-CM | POA: Diagnosis not present

## 2023-04-16 DIAGNOSIS — M5459 Other low back pain: Secondary | ICD-10-CM | POA: Diagnosis not present

## 2023-04-16 DIAGNOSIS — M25562 Pain in left knee: Secondary | ICD-10-CM | POA: Diagnosis not present

## 2023-04-18 DIAGNOSIS — M25562 Pain in left knee: Secondary | ICD-10-CM | POA: Diagnosis not present

## 2023-04-18 DIAGNOSIS — M47896 Other spondylosis, lumbar region: Secondary | ICD-10-CM | POA: Diagnosis not present

## 2023-04-18 DIAGNOSIS — M7062 Trochanteric bursitis, left hip: Secondary | ICD-10-CM | POA: Diagnosis not present

## 2023-04-18 DIAGNOSIS — M5459 Other low back pain: Secondary | ICD-10-CM | POA: Diagnosis not present

## 2023-04-18 DIAGNOSIS — M25552 Pain in left hip: Secondary | ICD-10-CM | POA: Diagnosis not present

## 2023-04-20 DIAGNOSIS — Z135 Encounter for screening for eye and ear disorders: Secondary | ICD-10-CM | POA: Diagnosis not present

## 2023-04-20 DIAGNOSIS — H524 Presbyopia: Secondary | ICD-10-CM | POA: Diagnosis not present

## 2023-04-20 DIAGNOSIS — H26493 Other secondary cataract, bilateral: Secondary | ICD-10-CM | POA: Diagnosis not present

## 2023-04-23 DIAGNOSIS — M7062 Trochanteric bursitis, left hip: Secondary | ICD-10-CM | POA: Diagnosis not present

## 2023-04-23 DIAGNOSIS — M47896 Other spondylosis, lumbar region: Secondary | ICD-10-CM | POA: Diagnosis not present

## 2023-04-23 DIAGNOSIS — M25562 Pain in left knee: Secondary | ICD-10-CM | POA: Diagnosis not present

## 2023-04-23 DIAGNOSIS — M5459 Other low back pain: Secondary | ICD-10-CM | POA: Diagnosis not present

## 2023-04-23 DIAGNOSIS — M25552 Pain in left hip: Secondary | ICD-10-CM | POA: Diagnosis not present

## 2023-04-27 DIAGNOSIS — M5459 Other low back pain: Secondary | ICD-10-CM | POA: Diagnosis not present

## 2023-04-27 DIAGNOSIS — M25552 Pain in left hip: Secondary | ICD-10-CM | POA: Diagnosis not present

## 2023-04-27 DIAGNOSIS — M47896 Other spondylosis, lumbar region: Secondary | ICD-10-CM | POA: Diagnosis not present

## 2023-04-27 DIAGNOSIS — M7062 Trochanteric bursitis, left hip: Secondary | ICD-10-CM | POA: Diagnosis not present

## 2023-04-27 DIAGNOSIS — M25562 Pain in left knee: Secondary | ICD-10-CM | POA: Diagnosis not present

## 2023-04-30 DIAGNOSIS — M25562 Pain in left knee: Secondary | ICD-10-CM | POA: Diagnosis not present

## 2023-04-30 DIAGNOSIS — M47896 Other spondylosis, lumbar region: Secondary | ICD-10-CM | POA: Diagnosis not present

## 2023-04-30 DIAGNOSIS — M5459 Other low back pain: Secondary | ICD-10-CM | POA: Diagnosis not present

## 2023-04-30 DIAGNOSIS — M7062 Trochanteric bursitis, left hip: Secondary | ICD-10-CM | POA: Diagnosis not present

## 2023-04-30 DIAGNOSIS — M25552 Pain in left hip: Secondary | ICD-10-CM | POA: Diagnosis not present

## 2023-05-04 DIAGNOSIS — M5459 Other low back pain: Secondary | ICD-10-CM | POA: Diagnosis not present

## 2023-05-04 DIAGNOSIS — M7062 Trochanteric bursitis, left hip: Secondary | ICD-10-CM | POA: Diagnosis not present

## 2023-05-04 DIAGNOSIS — M47896 Other spondylosis, lumbar region: Secondary | ICD-10-CM | POA: Diagnosis not present

## 2023-05-04 DIAGNOSIS — M25562 Pain in left knee: Secondary | ICD-10-CM | POA: Diagnosis not present

## 2023-05-04 DIAGNOSIS — M25552 Pain in left hip: Secondary | ICD-10-CM | POA: Diagnosis not present

## 2023-05-07 DIAGNOSIS — M5459 Other low back pain: Secondary | ICD-10-CM | POA: Diagnosis not present

## 2023-05-07 DIAGNOSIS — M25552 Pain in left hip: Secondary | ICD-10-CM | POA: Diagnosis not present

## 2023-05-07 DIAGNOSIS — M25562 Pain in left knee: Secondary | ICD-10-CM | POA: Diagnosis not present

## 2023-05-07 DIAGNOSIS — M47896 Other spondylosis, lumbar region: Secondary | ICD-10-CM | POA: Diagnosis not present

## 2023-05-07 DIAGNOSIS — M7062 Trochanteric bursitis, left hip: Secondary | ICD-10-CM | POA: Diagnosis not present

## 2023-05-11 DIAGNOSIS — M25562 Pain in left knee: Secondary | ICD-10-CM | POA: Diagnosis not present

## 2023-05-11 DIAGNOSIS — M5459 Other low back pain: Secondary | ICD-10-CM | POA: Diagnosis not present

## 2023-05-11 DIAGNOSIS — M25552 Pain in left hip: Secondary | ICD-10-CM | POA: Diagnosis not present

## 2023-05-11 DIAGNOSIS — M7062 Trochanteric bursitis, left hip: Secondary | ICD-10-CM | POA: Diagnosis not present

## 2023-05-11 DIAGNOSIS — M47896 Other spondylosis, lumbar region: Secondary | ICD-10-CM | POA: Diagnosis not present

## 2023-05-14 DIAGNOSIS — M25552 Pain in left hip: Secondary | ICD-10-CM | POA: Diagnosis not present

## 2023-05-14 DIAGNOSIS — M7062 Trochanteric bursitis, left hip: Secondary | ICD-10-CM | POA: Diagnosis not present

## 2023-05-14 DIAGNOSIS — M5459 Other low back pain: Secondary | ICD-10-CM | POA: Diagnosis not present

## 2023-05-14 DIAGNOSIS — M25562 Pain in left knee: Secondary | ICD-10-CM | POA: Diagnosis not present

## 2023-05-14 DIAGNOSIS — M47896 Other spondylosis, lumbar region: Secondary | ICD-10-CM | POA: Diagnosis not present

## 2023-05-16 DIAGNOSIS — F330 Major depressive disorder, recurrent, mild: Secondary | ICD-10-CM | POA: Diagnosis not present

## 2023-05-21 DIAGNOSIS — M47896 Other spondylosis, lumbar region: Secondary | ICD-10-CM | POA: Diagnosis not present

## 2023-05-21 DIAGNOSIS — M5459 Other low back pain: Secondary | ICD-10-CM | POA: Diagnosis not present

## 2023-05-21 DIAGNOSIS — M7062 Trochanteric bursitis, left hip: Secondary | ICD-10-CM | POA: Diagnosis not present

## 2023-05-21 DIAGNOSIS — M25562 Pain in left knee: Secondary | ICD-10-CM | POA: Diagnosis not present

## 2023-05-21 DIAGNOSIS — M25552 Pain in left hip: Secondary | ICD-10-CM | POA: Diagnosis not present

## 2023-05-25 DIAGNOSIS — M5459 Other low back pain: Secondary | ICD-10-CM | POA: Diagnosis not present

## 2023-05-25 DIAGNOSIS — M25552 Pain in left hip: Secondary | ICD-10-CM | POA: Diagnosis not present

## 2023-05-25 DIAGNOSIS — M7062 Trochanteric bursitis, left hip: Secondary | ICD-10-CM | POA: Diagnosis not present

## 2023-05-25 DIAGNOSIS — M25562 Pain in left knee: Secondary | ICD-10-CM | POA: Diagnosis not present

## 2023-05-25 DIAGNOSIS — M47896 Other spondylosis, lumbar region: Secondary | ICD-10-CM | POA: Diagnosis not present

## 2023-05-28 DIAGNOSIS — M25552 Pain in left hip: Secondary | ICD-10-CM | POA: Diagnosis not present

## 2023-05-28 DIAGNOSIS — M5459 Other low back pain: Secondary | ICD-10-CM | POA: Diagnosis not present

## 2023-05-28 DIAGNOSIS — M47896 Other spondylosis, lumbar region: Secondary | ICD-10-CM | POA: Diagnosis not present

## 2023-05-28 DIAGNOSIS — M25562 Pain in left knee: Secondary | ICD-10-CM | POA: Diagnosis not present

## 2023-05-28 DIAGNOSIS — M7062 Trochanteric bursitis, left hip: Secondary | ICD-10-CM | POA: Diagnosis not present

## 2023-06-04 DIAGNOSIS — M7062 Trochanteric bursitis, left hip: Secondary | ICD-10-CM | POA: Diagnosis not present

## 2023-06-04 DIAGNOSIS — M25562 Pain in left knee: Secondary | ICD-10-CM | POA: Diagnosis not present

## 2023-06-04 DIAGNOSIS — M5459 Other low back pain: Secondary | ICD-10-CM | POA: Diagnosis not present

## 2023-06-04 DIAGNOSIS — M47896 Other spondylosis, lumbar region: Secondary | ICD-10-CM | POA: Diagnosis not present

## 2023-06-04 DIAGNOSIS — M25552 Pain in left hip: Secondary | ICD-10-CM | POA: Diagnosis not present

## 2023-06-08 DIAGNOSIS — M7062 Trochanteric bursitis, left hip: Secondary | ICD-10-CM | POA: Diagnosis not present

## 2023-06-08 DIAGNOSIS — M25562 Pain in left knee: Secondary | ICD-10-CM | POA: Diagnosis not present

## 2023-06-08 DIAGNOSIS — M47896 Other spondylosis, lumbar region: Secondary | ICD-10-CM | POA: Diagnosis not present

## 2023-06-08 DIAGNOSIS — M5459 Other low back pain: Secondary | ICD-10-CM | POA: Diagnosis not present

## 2023-06-08 DIAGNOSIS — M25552 Pain in left hip: Secondary | ICD-10-CM | POA: Diagnosis not present

## 2023-06-18 DIAGNOSIS — M25562 Pain in left knee: Secondary | ICD-10-CM | POA: Diagnosis not present

## 2023-06-18 DIAGNOSIS — M7062 Trochanteric bursitis, left hip: Secondary | ICD-10-CM | POA: Diagnosis not present

## 2023-06-18 DIAGNOSIS — M25552 Pain in left hip: Secondary | ICD-10-CM | POA: Diagnosis not present

## 2023-06-18 DIAGNOSIS — M47896 Other spondylosis, lumbar region: Secondary | ICD-10-CM | POA: Diagnosis not present

## 2023-06-18 DIAGNOSIS — M5459 Other low back pain: Secondary | ICD-10-CM | POA: Diagnosis not present

## 2023-06-25 DIAGNOSIS — M25562 Pain in left knee: Secondary | ICD-10-CM | POA: Diagnosis not present

## 2023-06-25 DIAGNOSIS — M7062 Trochanteric bursitis, left hip: Secondary | ICD-10-CM | POA: Diagnosis not present

## 2023-06-25 DIAGNOSIS — M47896 Other spondylosis, lumbar region: Secondary | ICD-10-CM | POA: Diagnosis not present

## 2023-06-25 DIAGNOSIS — M25552 Pain in left hip: Secondary | ICD-10-CM | POA: Diagnosis not present

## 2023-06-25 DIAGNOSIS — M5459 Other low back pain: Secondary | ICD-10-CM | POA: Diagnosis not present

## 2023-07-02 DIAGNOSIS — M7062 Trochanteric bursitis, left hip: Secondary | ICD-10-CM | POA: Diagnosis not present

## 2023-07-02 DIAGNOSIS — M25552 Pain in left hip: Secondary | ICD-10-CM | POA: Diagnosis not present

## 2023-07-02 DIAGNOSIS — M5459 Other low back pain: Secondary | ICD-10-CM | POA: Diagnosis not present

## 2023-07-02 DIAGNOSIS — M47896 Other spondylosis, lumbar region: Secondary | ICD-10-CM | POA: Diagnosis not present

## 2023-07-02 DIAGNOSIS — M25562 Pain in left knee: Secondary | ICD-10-CM | POA: Diagnosis not present

## 2023-07-02 DIAGNOSIS — M791 Myalgia, unspecified site: Secondary | ICD-10-CM | POA: Diagnosis not present

## 2023-07-04 DIAGNOSIS — J029 Acute pharyngitis, unspecified: Secondary | ICD-10-CM | POA: Diagnosis not present

## 2023-07-09 DIAGNOSIS — M25562 Pain in left knee: Secondary | ICD-10-CM | POA: Diagnosis not present

## 2023-07-11 DIAGNOSIS — M5459 Other low back pain: Secondary | ICD-10-CM | POA: Diagnosis not present

## 2023-07-11 DIAGNOSIS — L570 Actinic keratosis: Secondary | ICD-10-CM | POA: Diagnosis not present

## 2023-07-11 DIAGNOSIS — M47896 Other spondylosis, lumbar region: Secondary | ICD-10-CM | POA: Diagnosis not present

## 2023-07-11 DIAGNOSIS — M25562 Pain in left knee: Secondary | ICD-10-CM | POA: Diagnosis not present

## 2023-07-11 DIAGNOSIS — L820 Inflamed seborrheic keratosis: Secondary | ICD-10-CM | POA: Diagnosis not present

## 2023-07-11 DIAGNOSIS — M25552 Pain in left hip: Secondary | ICD-10-CM | POA: Diagnosis not present

## 2023-07-11 DIAGNOSIS — M7062 Trochanteric bursitis, left hip: Secondary | ICD-10-CM | POA: Diagnosis not present

## 2023-07-20 DIAGNOSIS — M25552 Pain in left hip: Secondary | ICD-10-CM | POA: Diagnosis not present

## 2023-07-20 DIAGNOSIS — M47896 Other spondylosis, lumbar region: Secondary | ICD-10-CM | POA: Diagnosis not present

## 2023-07-20 DIAGNOSIS — F330 Major depressive disorder, recurrent, mild: Secondary | ICD-10-CM | POA: Diagnosis not present

## 2023-07-20 DIAGNOSIS — M25562 Pain in left knee: Secondary | ICD-10-CM | POA: Diagnosis not present

## 2023-07-20 DIAGNOSIS — M5459 Other low back pain: Secondary | ICD-10-CM | POA: Diagnosis not present

## 2023-07-20 DIAGNOSIS — M7062 Trochanteric bursitis, left hip: Secondary | ICD-10-CM | POA: Diagnosis not present

## 2023-07-23 DIAGNOSIS — S83242A Other tear of medial meniscus, current injury, left knee, initial encounter: Secondary | ICD-10-CM | POA: Diagnosis not present

## 2023-07-27 DIAGNOSIS — M47896 Other spondylosis, lumbar region: Secondary | ICD-10-CM | POA: Diagnosis not present

## 2023-07-27 DIAGNOSIS — M25562 Pain in left knee: Secondary | ICD-10-CM | POA: Diagnosis not present

## 2023-07-27 DIAGNOSIS — M7062 Trochanteric bursitis, left hip: Secondary | ICD-10-CM | POA: Diagnosis not present

## 2023-07-27 DIAGNOSIS — M25552 Pain in left hip: Secondary | ICD-10-CM | POA: Diagnosis not present

## 2023-07-27 DIAGNOSIS — M5459 Other low back pain: Secondary | ICD-10-CM | POA: Diagnosis not present

## 2023-08-01 DIAGNOSIS — M7062 Trochanteric bursitis, left hip: Secondary | ICD-10-CM | POA: Diagnosis not present

## 2023-08-01 DIAGNOSIS — M25562 Pain in left knee: Secondary | ICD-10-CM | POA: Diagnosis not present

## 2023-08-01 DIAGNOSIS — M5459 Other low back pain: Secondary | ICD-10-CM | POA: Diagnosis not present

## 2023-08-01 DIAGNOSIS — M25552 Pain in left hip: Secondary | ICD-10-CM | POA: Diagnosis not present

## 2023-08-01 DIAGNOSIS — M47896 Other spondylosis, lumbar region: Secondary | ICD-10-CM | POA: Diagnosis not present

## 2023-08-08 DIAGNOSIS — M47896 Other spondylosis, lumbar region: Secondary | ICD-10-CM | POA: Diagnosis not present

## 2023-08-08 DIAGNOSIS — M25552 Pain in left hip: Secondary | ICD-10-CM | POA: Diagnosis not present

## 2023-08-08 DIAGNOSIS — M7062 Trochanteric bursitis, left hip: Secondary | ICD-10-CM | POA: Diagnosis not present

## 2023-08-08 DIAGNOSIS — M5459 Other low back pain: Secondary | ICD-10-CM | POA: Diagnosis not present

## 2023-08-08 DIAGNOSIS — M25562 Pain in left knee: Secondary | ICD-10-CM | POA: Diagnosis not present

## 2023-08-10 DIAGNOSIS — X58XXXA Exposure to other specified factors, initial encounter: Secondary | ICD-10-CM | POA: Diagnosis not present

## 2023-08-10 DIAGNOSIS — S83282A Other tear of lateral meniscus, current injury, left knee, initial encounter: Secondary | ICD-10-CM | POA: Diagnosis not present

## 2023-08-10 DIAGNOSIS — G8918 Other acute postprocedural pain: Secondary | ICD-10-CM | POA: Diagnosis not present

## 2023-08-10 DIAGNOSIS — M94262 Chondromalacia, left knee: Secondary | ICD-10-CM | POA: Diagnosis not present

## 2023-08-10 DIAGNOSIS — Y999 Unspecified external cause status: Secondary | ICD-10-CM | POA: Diagnosis not present

## 2023-08-10 DIAGNOSIS — M67462 Ganglion, left knee: Secondary | ICD-10-CM | POA: Diagnosis not present

## 2023-08-10 DIAGNOSIS — S83272A Complex tear of lateral meniscus, current injury, left knee, initial encounter: Secondary | ICD-10-CM | POA: Diagnosis not present

## 2023-08-11 DIAGNOSIS — R7989 Other specified abnormal findings of blood chemistry: Secondary | ICD-10-CM | POA: Diagnosis not present

## 2023-08-11 DIAGNOSIS — Z0189 Encounter for other specified special examinations: Secondary | ICD-10-CM | POA: Diagnosis not present

## 2023-08-11 DIAGNOSIS — E559 Vitamin D deficiency, unspecified: Secondary | ICD-10-CM | POA: Diagnosis not present

## 2023-08-13 DIAGNOSIS — R278 Other lack of coordination: Secondary | ICD-10-CM | POA: Diagnosis not present

## 2023-08-13 DIAGNOSIS — F32A Depression, unspecified: Secondary | ICD-10-CM | POA: Diagnosis not present

## 2023-08-13 DIAGNOSIS — M6281 Muscle weakness (generalized): Secondary | ICD-10-CM | POA: Diagnosis not present

## 2023-08-13 DIAGNOSIS — F39 Unspecified mood [affective] disorder: Secondary | ICD-10-CM | POA: Diagnosis not present

## 2023-08-13 DIAGNOSIS — M23204 Derangement of unspecified medial meniscus due to old tear or injury, left knee: Secondary | ICD-10-CM | POA: Diagnosis not present

## 2023-08-13 DIAGNOSIS — K219 Gastro-esophageal reflux disease without esophagitis: Secondary | ICD-10-CM | POA: Diagnosis not present

## 2023-08-13 DIAGNOSIS — R262 Difficulty in walking, not elsewhere classified: Secondary | ICD-10-CM | POA: Diagnosis not present

## 2023-08-14 DIAGNOSIS — M23204 Derangement of unspecified medial meniscus due to old tear or injury, left knee: Secondary | ICD-10-CM | POA: Diagnosis not present

## 2023-08-14 DIAGNOSIS — M67911 Unspecified disorder of synovium and tendon, right shoulder: Secondary | ICD-10-CM | POA: Diagnosis not present

## 2023-08-14 DIAGNOSIS — Z9189 Other specified personal risk factors, not elsewhere classified: Secondary | ICD-10-CM | POA: Diagnosis not present

## 2023-08-14 DIAGNOSIS — M6281 Muscle weakness (generalized): Secondary | ICD-10-CM | POA: Diagnosis not present

## 2023-08-14 DIAGNOSIS — F39 Unspecified mood [affective] disorder: Secondary | ICD-10-CM | POA: Diagnosis not present

## 2023-08-14 DIAGNOSIS — R278 Other lack of coordination: Secondary | ICD-10-CM | POA: Diagnosis not present

## 2023-08-14 DIAGNOSIS — R262 Difficulty in walking, not elsewhere classified: Secondary | ICD-10-CM | POA: Diagnosis not present

## 2023-08-14 DIAGNOSIS — R7989 Other specified abnormal findings of blood chemistry: Secondary | ICD-10-CM | POA: Diagnosis not present

## 2023-08-15 DIAGNOSIS — R278 Other lack of coordination: Secondary | ICD-10-CM | POA: Diagnosis not present

## 2023-08-15 DIAGNOSIS — K219 Gastro-esophageal reflux disease without esophagitis: Secondary | ICD-10-CM | POA: Diagnosis not present

## 2023-08-15 DIAGNOSIS — M6281 Muscle weakness (generalized): Secondary | ICD-10-CM | POA: Diagnosis not present

## 2023-08-15 DIAGNOSIS — F39 Unspecified mood [affective] disorder: Secondary | ICD-10-CM | POA: Diagnosis not present

## 2023-08-15 DIAGNOSIS — F32A Depression, unspecified: Secondary | ICD-10-CM | POA: Diagnosis not present

## 2023-08-15 DIAGNOSIS — R262 Difficulty in walking, not elsewhere classified: Secondary | ICD-10-CM | POA: Diagnosis not present

## 2023-08-16 DIAGNOSIS — R262 Difficulty in walking, not elsewhere classified: Secondary | ICD-10-CM | POA: Diagnosis not present

## 2023-08-16 DIAGNOSIS — M6281 Muscle weakness (generalized): Secondary | ICD-10-CM | POA: Diagnosis not present

## 2023-08-16 DIAGNOSIS — R278 Other lack of coordination: Secondary | ICD-10-CM | POA: Diagnosis not present

## 2023-08-17 DIAGNOSIS — R278 Other lack of coordination: Secondary | ICD-10-CM | POA: Diagnosis not present

## 2023-08-17 DIAGNOSIS — M6281 Muscle weakness (generalized): Secondary | ICD-10-CM | POA: Diagnosis not present

## 2023-08-17 DIAGNOSIS — R262 Difficulty in walking, not elsewhere classified: Secondary | ICD-10-CM | POA: Diagnosis not present

## 2023-08-18 DIAGNOSIS — M6281 Muscle weakness (generalized): Secondary | ICD-10-CM | POA: Diagnosis not present

## 2023-08-18 DIAGNOSIS — R278 Other lack of coordination: Secondary | ICD-10-CM | POA: Diagnosis not present

## 2023-08-18 DIAGNOSIS — R262 Difficulty in walking, not elsewhere classified: Secondary | ICD-10-CM | POA: Diagnosis not present

## 2023-08-20 DIAGNOSIS — M67462 Ganglion, left knee: Secondary | ICD-10-CM | POA: Diagnosis not present

## 2023-08-20 DIAGNOSIS — R262 Difficulty in walking, not elsewhere classified: Secondary | ICD-10-CM | POA: Diagnosis not present

## 2023-08-20 DIAGNOSIS — F39 Unspecified mood [affective] disorder: Secondary | ICD-10-CM | POA: Diagnosis not present

## 2023-08-20 DIAGNOSIS — K219 Gastro-esophageal reflux disease without esophagitis: Secondary | ICD-10-CM | POA: Diagnosis not present

## 2023-08-20 DIAGNOSIS — R278 Other lack of coordination: Secondary | ICD-10-CM | POA: Diagnosis not present

## 2023-08-20 DIAGNOSIS — M6281 Muscle weakness (generalized): Secondary | ICD-10-CM | POA: Diagnosis not present

## 2023-08-20 DIAGNOSIS — F32A Depression, unspecified: Secondary | ICD-10-CM | POA: Diagnosis not present

## 2023-08-20 DIAGNOSIS — M23204 Derangement of unspecified medial meniscus due to old tear or injury, left knee: Secondary | ICD-10-CM | POA: Diagnosis not present

## 2023-08-29 DIAGNOSIS — R4181 Age-related cognitive decline: Secondary | ICD-10-CM | POA: Diagnosis not present

## 2023-08-29 DIAGNOSIS — Z4789 Encounter for other orthopedic aftercare: Secondary | ICD-10-CM | POA: Diagnosis not present

## 2023-08-29 DIAGNOSIS — M25562 Pain in left knee: Secondary | ICD-10-CM | POA: Diagnosis not present

## 2023-08-29 DIAGNOSIS — R41841 Cognitive communication deficit: Secondary | ICD-10-CM | POA: Diagnosis not present

## 2023-08-29 DIAGNOSIS — M6281 Muscle weakness (generalized): Secondary | ICD-10-CM | POA: Diagnosis not present

## 2023-08-30 DIAGNOSIS — R41841 Cognitive communication deficit: Secondary | ICD-10-CM | POA: Diagnosis not present

## 2023-08-30 DIAGNOSIS — M25562 Pain in left knee: Secondary | ICD-10-CM | POA: Diagnosis not present

## 2023-08-30 DIAGNOSIS — R4181 Age-related cognitive decline: Secondary | ICD-10-CM | POA: Diagnosis not present

## 2023-08-30 DIAGNOSIS — Z4789 Encounter for other orthopedic aftercare: Secondary | ICD-10-CM | POA: Diagnosis not present

## 2023-08-30 DIAGNOSIS — M6281 Muscle weakness (generalized): Secondary | ICD-10-CM | POA: Diagnosis not present

## 2023-08-31 DIAGNOSIS — F330 Major depressive disorder, recurrent, mild: Secondary | ICD-10-CM | POA: Diagnosis not present

## 2023-09-05 DIAGNOSIS — Z4789 Encounter for other orthopedic aftercare: Secondary | ICD-10-CM | POA: Diagnosis not present

## 2023-09-05 DIAGNOSIS — M25562 Pain in left knee: Secondary | ICD-10-CM | POA: Diagnosis not present

## 2023-09-05 DIAGNOSIS — M6281 Muscle weakness (generalized): Secondary | ICD-10-CM | POA: Diagnosis not present

## 2023-09-07 DIAGNOSIS — M25562 Pain in left knee: Secondary | ICD-10-CM | POA: Diagnosis not present

## 2023-09-07 DIAGNOSIS — Z4789 Encounter for other orthopedic aftercare: Secondary | ICD-10-CM | POA: Diagnosis not present

## 2023-09-07 DIAGNOSIS — M6281 Muscle weakness (generalized): Secondary | ICD-10-CM | POA: Diagnosis not present

## 2023-09-10 DIAGNOSIS — Z4789 Encounter for other orthopedic aftercare: Secondary | ICD-10-CM | POA: Diagnosis not present

## 2023-09-10 DIAGNOSIS — M25562 Pain in left knee: Secondary | ICD-10-CM | POA: Diagnosis not present

## 2023-09-10 DIAGNOSIS — M6281 Muscle weakness (generalized): Secondary | ICD-10-CM | POA: Diagnosis not present

## 2023-09-12 DIAGNOSIS — Z4789 Encounter for other orthopedic aftercare: Secondary | ICD-10-CM | POA: Diagnosis not present

## 2023-09-12 DIAGNOSIS — M6281 Muscle weakness (generalized): Secondary | ICD-10-CM | POA: Diagnosis not present

## 2023-09-12 DIAGNOSIS — M25562 Pain in left knee: Secondary | ICD-10-CM | POA: Diagnosis not present

## 2023-09-14 DIAGNOSIS — Z4789 Encounter for other orthopedic aftercare: Secondary | ICD-10-CM | POA: Diagnosis not present

## 2023-09-14 DIAGNOSIS — M6281 Muscle weakness (generalized): Secondary | ICD-10-CM | POA: Diagnosis not present

## 2023-09-14 DIAGNOSIS — M25562 Pain in left knee: Secondary | ICD-10-CM | POA: Diagnosis not present

## 2023-09-17 DIAGNOSIS — M6281 Muscle weakness (generalized): Secondary | ICD-10-CM | POA: Diagnosis not present

## 2023-09-17 DIAGNOSIS — Z4789 Encounter for other orthopedic aftercare: Secondary | ICD-10-CM | POA: Diagnosis not present

## 2023-09-17 DIAGNOSIS — M25562 Pain in left knee: Secondary | ICD-10-CM | POA: Diagnosis not present

## 2023-09-19 DIAGNOSIS — M6281 Muscle weakness (generalized): Secondary | ICD-10-CM | POA: Diagnosis not present

## 2023-09-19 DIAGNOSIS — M25562 Pain in left knee: Secondary | ICD-10-CM | POA: Diagnosis not present

## 2023-09-19 DIAGNOSIS — Z4789 Encounter for other orthopedic aftercare: Secondary | ICD-10-CM | POA: Diagnosis not present

## 2023-09-20 DIAGNOSIS — F3341 Major depressive disorder, recurrent, in partial remission: Secondary | ICD-10-CM | POA: Diagnosis not present

## 2023-09-21 DIAGNOSIS — M25562 Pain in left knee: Secondary | ICD-10-CM | POA: Diagnosis not present

## 2023-09-21 DIAGNOSIS — M6281 Muscle weakness (generalized): Secondary | ICD-10-CM | POA: Diagnosis not present

## 2023-09-21 DIAGNOSIS — Z4789 Encounter for other orthopedic aftercare: Secondary | ICD-10-CM | POA: Diagnosis not present

## 2023-09-24 DIAGNOSIS — M6281 Muscle weakness (generalized): Secondary | ICD-10-CM | POA: Diagnosis not present

## 2023-09-24 DIAGNOSIS — Z4789 Encounter for other orthopedic aftercare: Secondary | ICD-10-CM | POA: Diagnosis not present

## 2023-09-24 DIAGNOSIS — M25562 Pain in left knee: Secondary | ICD-10-CM | POA: Diagnosis not present

## 2023-09-26 DIAGNOSIS — M6281 Muscle weakness (generalized): Secondary | ICD-10-CM | POA: Diagnosis not present

## 2023-09-26 DIAGNOSIS — M25562 Pain in left knee: Secondary | ICD-10-CM | POA: Diagnosis not present

## 2023-09-26 DIAGNOSIS — Z4789 Encounter for other orthopedic aftercare: Secondary | ICD-10-CM | POA: Diagnosis not present

## 2023-09-27 DIAGNOSIS — I739 Peripheral vascular disease, unspecified: Secondary | ICD-10-CM | POA: Diagnosis not present

## 2023-09-27 DIAGNOSIS — B351 Tinea unguium: Secondary | ICD-10-CM | POA: Diagnosis not present

## 2023-10-01 DIAGNOSIS — Z4789 Encounter for other orthopedic aftercare: Secondary | ICD-10-CM | POA: Diagnosis not present

## 2023-10-01 DIAGNOSIS — M25562 Pain in left knee: Secondary | ICD-10-CM | POA: Diagnosis not present

## 2023-10-01 DIAGNOSIS — M6281 Muscle weakness (generalized): Secondary | ICD-10-CM | POA: Diagnosis not present

## 2023-10-03 DIAGNOSIS — Z4789 Encounter for other orthopedic aftercare: Secondary | ICD-10-CM | POA: Diagnosis not present

## 2023-10-03 DIAGNOSIS — M6281 Muscle weakness (generalized): Secondary | ICD-10-CM | POA: Diagnosis not present

## 2023-10-03 DIAGNOSIS — M25562 Pain in left knee: Secondary | ICD-10-CM | POA: Diagnosis not present

## 2023-10-05 DIAGNOSIS — M25562 Pain in left knee: Secondary | ICD-10-CM | POA: Diagnosis not present

## 2023-10-05 DIAGNOSIS — Z4789 Encounter for other orthopedic aftercare: Secondary | ICD-10-CM | POA: Diagnosis not present

## 2023-10-05 DIAGNOSIS — M6281 Muscle weakness (generalized): Secondary | ICD-10-CM | POA: Diagnosis not present

## 2023-10-08 DIAGNOSIS — Z4789 Encounter for other orthopedic aftercare: Secondary | ICD-10-CM | POA: Diagnosis not present

## 2023-10-08 DIAGNOSIS — M6281 Muscle weakness (generalized): Secondary | ICD-10-CM | POA: Diagnosis not present

## 2023-10-08 DIAGNOSIS — M25562 Pain in left knee: Secondary | ICD-10-CM | POA: Diagnosis not present

## 2023-10-10 DIAGNOSIS — M6281 Muscle weakness (generalized): Secondary | ICD-10-CM | POA: Diagnosis not present

## 2023-10-10 DIAGNOSIS — M25562 Pain in left knee: Secondary | ICD-10-CM | POA: Diagnosis not present

## 2023-10-10 DIAGNOSIS — Z4789 Encounter for other orthopedic aftercare: Secondary | ICD-10-CM | POA: Diagnosis not present

## 2023-10-10 DIAGNOSIS — F3341 Major depressive disorder, recurrent, in partial remission: Secondary | ICD-10-CM | POA: Diagnosis not present

## 2023-10-12 DIAGNOSIS — M6281 Muscle weakness (generalized): Secondary | ICD-10-CM | POA: Diagnosis not present

## 2023-10-12 DIAGNOSIS — M25562 Pain in left knee: Secondary | ICD-10-CM | POA: Diagnosis not present

## 2023-10-12 DIAGNOSIS — Z4789 Encounter for other orthopedic aftercare: Secondary | ICD-10-CM | POA: Diagnosis not present

## 2023-10-15 DIAGNOSIS — M25562 Pain in left knee: Secondary | ICD-10-CM | POA: Diagnosis not present

## 2023-10-15 DIAGNOSIS — M6281 Muscle weakness (generalized): Secondary | ICD-10-CM | POA: Diagnosis not present

## 2023-10-15 DIAGNOSIS — Z4789 Encounter for other orthopedic aftercare: Secondary | ICD-10-CM | POA: Diagnosis not present

## 2023-10-17 DIAGNOSIS — Z4789 Encounter for other orthopedic aftercare: Secondary | ICD-10-CM | POA: Diagnosis not present

## 2023-10-17 DIAGNOSIS — M6281 Muscle weakness (generalized): Secondary | ICD-10-CM | POA: Diagnosis not present

## 2023-10-17 DIAGNOSIS — M25562 Pain in left knee: Secondary | ICD-10-CM | POA: Diagnosis not present

## 2023-10-24 DIAGNOSIS — M6281 Muscle weakness (generalized): Secondary | ICD-10-CM | POA: Diagnosis not present

## 2023-10-24 DIAGNOSIS — M25562 Pain in left knee: Secondary | ICD-10-CM | POA: Diagnosis not present

## 2023-10-24 DIAGNOSIS — Z4789 Encounter for other orthopedic aftercare: Secondary | ICD-10-CM | POA: Diagnosis not present

## 2023-10-26 DIAGNOSIS — Z4789 Encounter for other orthopedic aftercare: Secondary | ICD-10-CM | POA: Diagnosis not present

## 2023-10-26 DIAGNOSIS — M6281 Muscle weakness (generalized): Secondary | ICD-10-CM | POA: Diagnosis not present

## 2023-10-26 DIAGNOSIS — M25562 Pain in left knee: Secondary | ICD-10-CM | POA: Diagnosis not present

## 2023-10-31 DIAGNOSIS — Z4789 Encounter for other orthopedic aftercare: Secondary | ICD-10-CM | POA: Diagnosis not present

## 2023-10-31 DIAGNOSIS — M25562 Pain in left knee: Secondary | ICD-10-CM | POA: Diagnosis not present

## 2023-10-31 DIAGNOSIS — M6281 Muscle weakness (generalized): Secondary | ICD-10-CM | POA: Diagnosis not present

## 2023-11-02 DIAGNOSIS — M6281 Muscle weakness (generalized): Secondary | ICD-10-CM | POA: Diagnosis not present

## 2023-11-02 DIAGNOSIS — M25562 Pain in left knee: Secondary | ICD-10-CM | POA: Diagnosis not present

## 2023-11-02 DIAGNOSIS — Z4789 Encounter for other orthopedic aftercare: Secondary | ICD-10-CM | POA: Diagnosis not present

## 2023-11-05 DIAGNOSIS — M6281 Muscle weakness (generalized): Secondary | ICD-10-CM | POA: Diagnosis not present

## 2023-11-05 DIAGNOSIS — Z4789 Encounter for other orthopedic aftercare: Secondary | ICD-10-CM | POA: Diagnosis not present

## 2023-11-05 DIAGNOSIS — M25562 Pain in left knee: Secondary | ICD-10-CM | POA: Diagnosis not present

## 2023-11-07 DIAGNOSIS — M6281 Muscle weakness (generalized): Secondary | ICD-10-CM | POA: Diagnosis not present

## 2023-11-07 DIAGNOSIS — M25562 Pain in left knee: Secondary | ICD-10-CM | POA: Diagnosis not present

## 2023-11-07 DIAGNOSIS — Z4789 Encounter for other orthopedic aftercare: Secondary | ICD-10-CM | POA: Diagnosis not present

## 2023-11-14 DIAGNOSIS — Z4789 Encounter for other orthopedic aftercare: Secondary | ICD-10-CM | POA: Diagnosis not present

## 2023-11-14 DIAGNOSIS — M25562 Pain in left knee: Secondary | ICD-10-CM | POA: Diagnosis not present

## 2023-11-14 DIAGNOSIS — M6281 Muscle weakness (generalized): Secondary | ICD-10-CM | POA: Diagnosis not present

## 2023-11-19 DIAGNOSIS — M6281 Muscle weakness (generalized): Secondary | ICD-10-CM | POA: Diagnosis not present

## 2023-11-19 DIAGNOSIS — Z4789 Encounter for other orthopedic aftercare: Secondary | ICD-10-CM | POA: Diagnosis not present

## 2023-11-19 DIAGNOSIS — M25562 Pain in left knee: Secondary | ICD-10-CM | POA: Diagnosis not present

## 2023-11-26 DIAGNOSIS — M6281 Muscle weakness (generalized): Secondary | ICD-10-CM | POA: Diagnosis not present

## 2023-11-26 DIAGNOSIS — Z4789 Encounter for other orthopedic aftercare: Secondary | ICD-10-CM | POA: Diagnosis not present

## 2023-11-26 DIAGNOSIS — M25562 Pain in left knee: Secondary | ICD-10-CM | POA: Diagnosis not present

## 2023-11-28 DIAGNOSIS — F3341 Major depressive disorder, recurrent, in partial remission: Secondary | ICD-10-CM | POA: Diagnosis not present

## 2023-12-03 DIAGNOSIS — M6281 Muscle weakness (generalized): Secondary | ICD-10-CM | POA: Diagnosis not present

## 2023-12-03 DIAGNOSIS — Z1231 Encounter for screening mammogram for malignant neoplasm of breast: Secondary | ICD-10-CM | POA: Diagnosis not present

## 2023-12-03 DIAGNOSIS — R92333 Mammographic heterogeneous density, bilateral breasts: Secondary | ICD-10-CM | POA: Diagnosis not present

## 2023-12-03 DIAGNOSIS — M25562 Pain in left knee: Secondary | ICD-10-CM | POA: Diagnosis not present

## 2023-12-03 DIAGNOSIS — Z4789 Encounter for other orthopedic aftercare: Secondary | ICD-10-CM | POA: Diagnosis not present

## 2023-12-05 DIAGNOSIS — L814 Other melanin hyperpigmentation: Secondary | ICD-10-CM | POA: Diagnosis not present

## 2023-12-05 DIAGNOSIS — L570 Actinic keratosis: Secondary | ICD-10-CM | POA: Diagnosis not present

## 2023-12-05 DIAGNOSIS — L821 Other seborrheic keratosis: Secondary | ICD-10-CM | POA: Diagnosis not present

## 2023-12-19 DIAGNOSIS — M6281 Muscle weakness (generalized): Secondary | ICD-10-CM | POA: Diagnosis not present

## 2023-12-19 DIAGNOSIS — M25562 Pain in left knee: Secondary | ICD-10-CM | POA: Diagnosis not present

## 2023-12-19 DIAGNOSIS — Z4789 Encounter for other orthopedic aftercare: Secondary | ICD-10-CM | POA: Diagnosis not present

## 2023-12-26 DIAGNOSIS — Z4789 Encounter for other orthopedic aftercare: Secondary | ICD-10-CM | POA: Diagnosis not present

## 2023-12-26 DIAGNOSIS — M6281 Muscle weakness (generalized): Secondary | ICD-10-CM | POA: Diagnosis not present

## 2023-12-26 DIAGNOSIS — M25562 Pain in left knee: Secondary | ICD-10-CM | POA: Diagnosis not present

## 2023-12-27 DIAGNOSIS — F418 Other specified anxiety disorders: Secondary | ICD-10-CM | POA: Diagnosis not present

## 2023-12-27 DIAGNOSIS — F325 Major depressive disorder, single episode, in full remission: Secondary | ICD-10-CM | POA: Diagnosis not present

## 2023-12-27 DIAGNOSIS — M25562 Pain in left knee: Secondary | ICD-10-CM | POA: Diagnosis not present

## 2023-12-27 DIAGNOSIS — R4789 Other speech disturbances: Secondary | ICD-10-CM | POA: Diagnosis not present

## 2023-12-27 DIAGNOSIS — D229 Melanocytic nevi, unspecified: Secondary | ICD-10-CM | POA: Diagnosis not present

## 2023-12-27 DIAGNOSIS — M25511 Pain in right shoulder: Secondary | ICD-10-CM | POA: Diagnosis not present

## 2023-12-27 DIAGNOSIS — R9230 Dense breasts, unspecified: Secondary | ICD-10-CM | POA: Diagnosis not present

## 2024-01-01 DIAGNOSIS — L2989 Other pruritus: Secondary | ICD-10-CM | POA: Diagnosis not present

## 2024-01-01 DIAGNOSIS — L821 Other seborrheic keratosis: Secondary | ICD-10-CM | POA: Diagnosis not present

## 2024-01-01 DIAGNOSIS — L538 Other specified erythematous conditions: Secondary | ICD-10-CM | POA: Diagnosis not present

## 2024-01-01 DIAGNOSIS — L570 Actinic keratosis: Secondary | ICD-10-CM | POA: Diagnosis not present

## 2024-01-01 DIAGNOSIS — L578 Other skin changes due to chronic exposure to nonionizing radiation: Secondary | ICD-10-CM | POA: Diagnosis not present

## 2024-01-01 DIAGNOSIS — L820 Inflamed seborrheic keratosis: Secondary | ICD-10-CM | POA: Diagnosis not present

## 2024-01-02 DIAGNOSIS — M545 Low back pain, unspecified: Secondary | ICD-10-CM | POA: Diagnosis not present

## 2024-01-02 DIAGNOSIS — M67462 Ganglion, left knee: Secondary | ICD-10-CM | POA: Diagnosis not present

## 2024-01-03 DIAGNOSIS — M25511 Pain in right shoulder: Secondary | ICD-10-CM | POA: Diagnosis not present

## 2024-01-04 DIAGNOSIS — M25511 Pain in right shoulder: Secondary | ICD-10-CM | POA: Diagnosis not present

## 2024-01-04 DIAGNOSIS — Z4789 Encounter for other orthopedic aftercare: Secondary | ICD-10-CM | POA: Diagnosis not present

## 2024-01-04 DIAGNOSIS — M6281 Muscle weakness (generalized): Secondary | ICD-10-CM | POA: Diagnosis not present

## 2024-01-04 DIAGNOSIS — M25562 Pain in left knee: Secondary | ICD-10-CM | POA: Diagnosis not present

## 2024-01-09 DIAGNOSIS — M23032 Cystic meniscus, other medial meniscus, left knee: Secondary | ICD-10-CM | POA: Diagnosis not present

## 2024-01-11 DIAGNOSIS — M25511 Pain in right shoulder: Secondary | ICD-10-CM | POA: Diagnosis not present

## 2024-01-11 DIAGNOSIS — M6281 Muscle weakness (generalized): Secondary | ICD-10-CM | POA: Diagnosis not present

## 2024-01-11 DIAGNOSIS — M25562 Pain in left knee: Secondary | ICD-10-CM | POA: Diagnosis not present

## 2024-01-11 DIAGNOSIS — Z4789 Encounter for other orthopedic aftercare: Secondary | ICD-10-CM | POA: Diagnosis not present

## 2024-01-15 DIAGNOSIS — M25562 Pain in left knee: Secondary | ICD-10-CM | POA: Diagnosis not present

## 2024-01-15 DIAGNOSIS — Z4789 Encounter for other orthopedic aftercare: Secondary | ICD-10-CM | POA: Diagnosis not present

## 2024-01-15 DIAGNOSIS — M6281 Muscle weakness (generalized): Secondary | ICD-10-CM | POA: Diagnosis not present

## 2024-01-15 DIAGNOSIS — M25511 Pain in right shoulder: Secondary | ICD-10-CM | POA: Diagnosis not present

## 2024-01-16 DIAGNOSIS — F3341 Major depressive disorder, recurrent, in partial remission: Secondary | ICD-10-CM | POA: Diagnosis not present

## 2024-01-23 DIAGNOSIS — M25562 Pain in left knee: Secondary | ICD-10-CM | POA: Diagnosis not present

## 2024-01-23 DIAGNOSIS — M25511 Pain in right shoulder: Secondary | ICD-10-CM | POA: Diagnosis not present

## 2024-01-23 DIAGNOSIS — Z4789 Encounter for other orthopedic aftercare: Secondary | ICD-10-CM | POA: Diagnosis not present

## 2024-01-23 DIAGNOSIS — M6281 Muscle weakness (generalized): Secondary | ICD-10-CM | POA: Diagnosis not present

## 2024-01-24 DIAGNOSIS — M810 Age-related osteoporosis without current pathological fracture: Secondary | ICD-10-CM | POA: Diagnosis not present

## 2024-01-24 DIAGNOSIS — R921 Mammographic calcification found on diagnostic imaging of breast: Secondary | ICD-10-CM | POA: Diagnosis not present

## 2024-01-28 DIAGNOSIS — M25511 Pain in right shoulder: Secondary | ICD-10-CM | POA: Diagnosis not present

## 2024-01-28 DIAGNOSIS — M6281 Muscle weakness (generalized): Secondary | ICD-10-CM | POA: Diagnosis not present

## 2024-01-28 DIAGNOSIS — Z4789 Encounter for other orthopedic aftercare: Secondary | ICD-10-CM | POA: Diagnosis not present

## 2024-01-28 DIAGNOSIS — M25562 Pain in left knee: Secondary | ICD-10-CM | POA: Diagnosis not present

## 2024-02-06 ENCOUNTER — Other Ambulatory Visit (HOSPITAL_COMMUNITY): Payer: Self-pay | Admitting: Internal Medicine

## 2024-02-06 ENCOUNTER — Telehealth (HOSPITAL_COMMUNITY): Payer: Self-pay

## 2024-02-06 DIAGNOSIS — M25511 Pain in right shoulder: Secondary | ICD-10-CM | POA: Diagnosis not present

## 2024-02-06 DIAGNOSIS — M25562 Pain in left knee: Secondary | ICD-10-CM | POA: Diagnosis not present

## 2024-02-06 DIAGNOSIS — Z4789 Encounter for other orthopedic aftercare: Secondary | ICD-10-CM | POA: Diagnosis not present

## 2024-02-06 DIAGNOSIS — M6281 Muscle weakness (generalized): Secondary | ICD-10-CM | POA: Diagnosis not present

## 2024-02-06 NOTE — Telephone Encounter (Signed)
 Auth Submission: NO AUTH NEEDED Site of care: Site of care: [facility] Payer: Medicare A/B, AARP Supplement Medication & CPT/J Code(s) submitted: Prolia (Denosumab) [id number] Diagnosis Code: M81.0 Route of submission (phone, fax, portal):  Phone # Fax # Auth type: Buy/Bill HB Units/visits requested: 60mg  q 6 months x 2 doses Reference number:  Approval from: 02/06/24 to 07/05/24

## 2024-02-11 DIAGNOSIS — M6281 Muscle weakness (generalized): Secondary | ICD-10-CM | POA: Diagnosis not present

## 2024-02-11 DIAGNOSIS — Z4789 Encounter for other orthopedic aftercare: Secondary | ICD-10-CM | POA: Diagnosis not present

## 2024-02-11 DIAGNOSIS — M25511 Pain in right shoulder: Secondary | ICD-10-CM | POA: Diagnosis not present

## 2024-02-11 DIAGNOSIS — M25562 Pain in left knee: Secondary | ICD-10-CM | POA: Diagnosis not present

## 2024-02-12 ENCOUNTER — Ambulatory Visit (HOSPITAL_COMMUNITY)
Admission: RE | Admit: 2024-02-12 | Discharge: 2024-02-12 | Disposition: A | Discharge status: Home / Self Care | Source: Ambulatory Visit | Attending: Internal Medicine | Admitting: Internal Medicine

## 2024-02-12 ENCOUNTER — Inpatient Hospital Stay (HOSPITAL_COMMUNITY): Admission: RE | Admit: 2024-02-12 | Source: Ambulatory Visit

## 2024-02-12 VITALS — BP 137/75 | HR 102 | Temp 97.3°F | Resp 16

## 2024-02-12 DIAGNOSIS — M810 Age-related osteoporosis without current pathological fracture: Secondary | ICD-10-CM | POA: Diagnosis not present

## 2024-02-12 MED ORDER — DENOSUMAB 60 MG/ML ~~LOC~~ SOSY
60.0000 mg | PREFILLED_SYRINGE | Freq: Once | SUBCUTANEOUS | Status: AC
  Administered 2024-02-12: 60 mg via SUBCUTANEOUS

## 2024-02-12 MED ORDER — DENOSUMAB 60 MG/ML ~~LOC~~ SOSY
PREFILLED_SYRINGE | SUBCUTANEOUS | Status: AC
  Filled 2024-02-12: qty 1

## 2024-02-18 DIAGNOSIS — Z4789 Encounter for other orthopedic aftercare: Secondary | ICD-10-CM | POA: Diagnosis not present

## 2024-02-18 DIAGNOSIS — M25562 Pain in left knee: Secondary | ICD-10-CM | POA: Diagnosis not present

## 2024-02-18 DIAGNOSIS — M25511 Pain in right shoulder: Secondary | ICD-10-CM | POA: Diagnosis not present

## 2024-02-18 DIAGNOSIS — M6281 Muscle weakness (generalized): Secondary | ICD-10-CM | POA: Diagnosis not present

## 2024-03-03 DIAGNOSIS — M6281 Muscle weakness (generalized): Secondary | ICD-10-CM | POA: Diagnosis not present

## 2024-03-03 DIAGNOSIS — M25562 Pain in left knee: Secondary | ICD-10-CM | POA: Diagnosis not present

## 2024-03-03 DIAGNOSIS — Z4789 Encounter for other orthopedic aftercare: Secondary | ICD-10-CM | POA: Diagnosis not present

## 2024-03-03 DIAGNOSIS — M25511 Pain in right shoulder: Secondary | ICD-10-CM | POA: Diagnosis not present

## 2024-03-04 DIAGNOSIS — F3341 Major depressive disorder, recurrent, in partial remission: Secondary | ICD-10-CM | POA: Diagnosis not present

## 2024-03-10 DIAGNOSIS — M25562 Pain in left knee: Secondary | ICD-10-CM | POA: Diagnosis not present

## 2024-03-10 DIAGNOSIS — Z4789 Encounter for other orthopedic aftercare: Secondary | ICD-10-CM | POA: Diagnosis not present

## 2024-03-10 DIAGNOSIS — M6281 Muscle weakness (generalized): Secondary | ICD-10-CM | POA: Diagnosis not present

## 2024-03-10 DIAGNOSIS — M25511 Pain in right shoulder: Secondary | ICD-10-CM | POA: Diagnosis not present

## 2024-03-11 DIAGNOSIS — M25511 Pain in right shoulder: Secondary | ICD-10-CM | POA: Diagnosis not present

## 2024-03-14 DIAGNOSIS — Z23 Encounter for immunization: Secondary | ICD-10-CM | POA: Diagnosis not present

## 2024-03-18 DIAGNOSIS — M25511 Pain in right shoulder: Secondary | ICD-10-CM | POA: Diagnosis not present

## 2024-03-18 DIAGNOSIS — Z4789 Encounter for other orthopedic aftercare: Secondary | ICD-10-CM | POA: Diagnosis not present

## 2024-03-18 DIAGNOSIS — M25562 Pain in left knee: Secondary | ICD-10-CM | POA: Diagnosis not present

## 2024-03-18 DIAGNOSIS — M6281 Muscle weakness (generalized): Secondary | ICD-10-CM | POA: Diagnosis not present

## 2024-03-26 DIAGNOSIS — M25562 Pain in left knee: Secondary | ICD-10-CM | POA: Diagnosis not present

## 2024-03-26 DIAGNOSIS — Z4789 Encounter for other orthopedic aftercare: Secondary | ICD-10-CM | POA: Diagnosis not present

## 2024-03-26 DIAGNOSIS — M25511 Pain in right shoulder: Secondary | ICD-10-CM | POA: Diagnosis not present

## 2024-03-26 DIAGNOSIS — M6281 Muscle weakness (generalized): Secondary | ICD-10-CM | POA: Diagnosis not present

## 2024-04-02 DIAGNOSIS — M25562 Pain in left knee: Secondary | ICD-10-CM | POA: Diagnosis not present

## 2024-04-02 DIAGNOSIS — Z4789 Encounter for other orthopedic aftercare: Secondary | ICD-10-CM | POA: Diagnosis not present

## 2024-04-02 DIAGNOSIS — M25511 Pain in right shoulder: Secondary | ICD-10-CM | POA: Diagnosis not present

## 2024-04-02 DIAGNOSIS — M6281 Muscle weakness (generalized): Secondary | ICD-10-CM | POA: Diagnosis not present

## 2024-04-07 DIAGNOSIS — Z1212 Encounter for screening for malignant neoplasm of rectum: Secondary | ICD-10-CM | POA: Diagnosis not present

## 2024-04-07 DIAGNOSIS — M810 Age-related osteoporosis without current pathological fracture: Secondary | ICD-10-CM | POA: Diagnosis not present

## 2024-04-07 DIAGNOSIS — K5900 Constipation, unspecified: Secondary | ICD-10-CM | POA: Diagnosis not present

## 2024-04-07 DIAGNOSIS — E785 Hyperlipidemia, unspecified: Secondary | ICD-10-CM | POA: Diagnosis not present

## 2024-04-07 DIAGNOSIS — E7849 Other hyperlipidemia: Secondary | ICD-10-CM | POA: Diagnosis not present

## 2024-04-08 DIAGNOSIS — F3341 Major depressive disorder, recurrent, in partial remission: Secondary | ICD-10-CM | POA: Diagnosis not present

## 2024-04-11 DIAGNOSIS — M25562 Pain in left knee: Secondary | ICD-10-CM | POA: Diagnosis not present

## 2024-04-11 DIAGNOSIS — M25511 Pain in right shoulder: Secondary | ICD-10-CM | POA: Diagnosis not present

## 2024-04-11 DIAGNOSIS — Z4789 Encounter for other orthopedic aftercare: Secondary | ICD-10-CM | POA: Diagnosis not present

## 2024-04-11 DIAGNOSIS — M6281 Muscle weakness (generalized): Secondary | ICD-10-CM | POA: Diagnosis not present

## 2024-04-14 DIAGNOSIS — M25511 Pain in right shoulder: Secondary | ICD-10-CM | POA: Diagnosis not present

## 2024-04-14 DIAGNOSIS — E785 Hyperlipidemia, unspecified: Secondary | ICD-10-CM | POA: Diagnosis not present

## 2024-04-14 DIAGNOSIS — Z0000 Encounter for general adult medical examination without abnormal findings: Secondary | ICD-10-CM | POA: Diagnosis not present

## 2024-04-14 DIAGNOSIS — Z1331 Encounter for screening for depression: Secondary | ICD-10-CM | POA: Diagnosis not present

## 2024-04-14 DIAGNOSIS — N6019 Diffuse cystic mastopathy of unspecified breast: Secondary | ICD-10-CM | POA: Diagnosis not present

## 2024-04-14 DIAGNOSIS — Z1389 Encounter for screening for other disorder: Secondary | ICD-10-CM | POA: Diagnosis not present

## 2024-04-14 DIAGNOSIS — I872 Venous insufficiency (chronic) (peripheral): Secondary | ICD-10-CM | POA: Diagnosis not present

## 2024-04-14 DIAGNOSIS — M25562 Pain in left knee: Secondary | ICD-10-CM | POA: Diagnosis not present

## 2024-04-14 DIAGNOSIS — R194 Change in bowel habit: Secondary | ICD-10-CM | POA: Diagnosis not present

## 2024-04-14 DIAGNOSIS — D229 Melanocytic nevi, unspecified: Secondary | ICD-10-CM | POA: Diagnosis not present

## 2024-04-14 DIAGNOSIS — R82998 Other abnormal findings in urine: Secondary | ICD-10-CM | POA: Diagnosis not present

## 2024-04-14 DIAGNOSIS — M1990 Unspecified osteoarthritis, unspecified site: Secondary | ICD-10-CM | POA: Diagnosis not present

## 2024-04-14 DIAGNOSIS — R4789 Other speech disturbances: Secondary | ICD-10-CM | POA: Diagnosis not present

## 2024-04-14 DIAGNOSIS — F325 Major depressive disorder, single episode, in full remission: Secondary | ICD-10-CM | POA: Diagnosis not present

## 2024-04-14 DIAGNOSIS — M810 Age-related osteoporosis without current pathological fracture: Secondary | ICD-10-CM | POA: Diagnosis not present

## 2024-04-22 DIAGNOSIS — F3341 Major depressive disorder, recurrent, in partial remission: Secondary | ICD-10-CM | POA: Diagnosis not present

## 2024-08-11 ENCOUNTER — Inpatient Hospital Stay (HOSPITAL_COMMUNITY): Admission: RE | Admit: 2024-08-11 | Source: Ambulatory Visit
